# Patient Record
Sex: Male | Born: 1990 | Race: Black or African American | Hispanic: No | Marital: Single | Smoking: Current every day smoker
Health system: Southern US, Community
[De-identification: ages and names within clinical notes are randomized; demographics above are authoritative.]

## PROBLEM LIST (undated history)

## (undated) DIAGNOSIS — F1910 Other psychoactive substance abuse, uncomplicated: Secondary | ICD-10-CM

## (undated) DIAGNOSIS — Z5900 Homelessness unspecified: Secondary | ICD-10-CM

## (undated) HISTORY — PX: KNEE SURGERY: SHX244

---
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
## 2018-04-21 ENCOUNTER — Encounter (HOSPITAL_COMMUNITY): Payer: Self-pay | Admitting: Family Medicine

## 2018-04-21 ENCOUNTER — Emergency Department (HOSPITAL_COMMUNITY)
Admission: EM | Admit: 2018-04-21 | Discharge: 2018-04-21 | Discharge status: Against Medical Advice | Payer: Self-pay | Attending: Emergency Medicine | Admitting: Emergency Medicine

## 2018-04-21 ENCOUNTER — Other Ambulatory Visit: Payer: Self-pay

## 2018-04-21 DIAGNOSIS — T401X1A Poisoning by heroin, accidental (unintentional), initial encounter: Secondary | ICD-10-CM | POA: Insufficient documentation

## 2018-04-21 DIAGNOSIS — F17210 Nicotine dependence, cigarettes, uncomplicated: Secondary | ICD-10-CM | POA: Insufficient documentation

## 2018-04-21 LAB — CBG MONITORING, ED: Glucose-Capillary: 141 mg/dL — ABNORMAL HIGH (ref 70–99)

## 2018-04-21 MED ORDER — NALOXONE HCL 2 MG/2ML IJ SOSY
PREFILLED_SYRINGE | INTRAMUSCULAR | Status: AC
  Administered 2018-04-21: 22:00:00
  Filled 2018-04-21: qty 2

## 2018-04-21 NOTE — ED Triage Notes (Signed)
Patient was brought in by his mother. She reported he was a possible overdose.

## 2018-04-21 NOTE — ED Notes (Signed)
Patient immediately aroused up after [facility] administered. Patient is now demanding to leave. Patients mother is at bedside. Dr. Jeanell Sparrow is already been at bedside to explain about the plan of care and the risk of leaving.

## 2018-04-21 NOTE — ED Notes (Signed)
Dr. Jeanell Sparrow made aware that patient wants to leave-at bedside to talk to patient. Patient's Mother at bedside

## 2018-04-21 NOTE — ED Notes (Addendum)
Dr. Jeanell Sparrow came back into the room after patient signed his AMA form to inquire about how he is going to leave and what was his plan of care. Patient left facility via ambulatory with his mother.

## 2018-04-21 NOTE — ED Provider Notes (Signed)
[place] DEPT Provider Note   CSN: [id number] Arrival date & time: 04/21/18  2146     History   Chief Complaint Chief Complaint  Patient presents with  . Drug Overdose    HPI Calvin Sanchez is a 27 y.o. male. Level 5 caveat HPI  27 year old male presents today with altered mental status.  He presented via private vehicle.  He was brought in on a stretcher by nursing.  He has agonal respirations and is unresponsive  History reviewed. No pertinent past medical history.  There are no active problems to display for this patient.   Past Surgical History:  Procedure Laterality Date  . KNEE SURGERY Left         Home Medications    Prior to Admission medications   Not on File    Family History History reviewed. No pertinent family history.  Social History Social History   Tobacco Use  . Smoking status: Current Every Day Smoker    Types: Cigars  . Smokeless tobacco: Never Used  Substance Use Topics  . Alcohol use: Yes    Comment: Once a week. Last drink: yesterday   . Drug use: Yes    Types: Cocaine, Methamphetamines    Comment: Heronin. Last used: PTA. Cocaine and Meth last used this weekend.      Allergies   Patient has no allergy information on record.   Review of Systems Review of Systems  Unable to perform ROS: Acuity of condition     Physical Exam Updated Vital Signs BP (!) 123/97 (BP Location: Left Arm)   Pulse 99   Temp 98 F (36.7 C) (Oral)   Resp (!) 8   Ht 1.854 m (6\' 1" )   Wt 68 kg   SpO2 100%   BMI 19.79 kg/m   Physical Exam  Constitutional: He appears well-developed and well-nourished. No distress.  HENT:  Head: Normocephalic and atraumatic.  Right Ear: External ear normal.  Left Ear: External ear normal.  Mouth/Throat: Oropharynx is clear and moist.  Eyes:  Pinpoint pupils bilaterally  Neck: Normal range of motion.  Cardiovascular: Normal rate and regular rhythm.  Pulmonary/Chest:  Breath sounds normal.  Agonal respirations at 4 to 6/min  Abdominal: Soft. Bowel sounds are normal.  Musculoskeletal: Normal range of motion.  Neurological: He is unresponsive.  Skin: Skin is warm. Capillary refill takes less than 2 seconds.  Nursing note and vitals reviewed.    ED Treatments / Results  Labs (all labs ordered are listed, but only abnormal results are displayed) Labs Reviewed  CBG MONITORING, ED - Abnormal; Notable for the following components:      Result Value   Glucose-Capillary 141 (*)    All other components within normal limits    EKG None  Radiology No results found.  Procedures Procedures (including critical care time)  Medications Ordered in ED Medications  naloxone (NARCAN) 2 MG/2ML injection (has no administration in time range)     Initial Impression / Assessment and Plan / ED Course  I have reviewed the triage vital signs and the nursing notes.  Pertinent labs & imaging results that were available during my care of the patient were reviewed by me and considered in my medical decision making (see chart for details).     Patient given Narcan here and awake and alert. 10:06 PM Patient wishes to leave.  Discussed the fact that Narcan can have a shorter half life than the narcotic.  His mother is at  bedside and is being encouraged to stay Patient denies any suicidal ideation or health self-harm He is awake and alert and insistent on leaving.  Mother at bedside.  I discouraged leaving Calvin Sanchez but he is not insistent and appears to have competency. Final Clinical Impressions(s) / ED Diagnoses   Final diagnoses:  Accidental overdose of heroin, initial encounter [facility])    ED Discharge Orders    None       Pattricia Boss, MD 04/21/18 2214

## 2019-12-29 ENCOUNTER — Inpatient Hospital Stay (HOSPITAL_COMMUNITY)
Admission: EM | Admit: 2019-12-29 | Discharge: 2019-12-29 | DRG: 872 | Discharge status: Against Medical Advice | Payer: Self-pay | Attending: Internal Medicine | Admitting: Internal Medicine

## 2019-12-29 ENCOUNTER — Other Ambulatory Visit: Payer: Self-pay

## 2019-12-29 ENCOUNTER — Encounter (HOSPITAL_COMMUNITY): Payer: Self-pay | Admitting: Emergency Medicine

## 2019-12-29 ENCOUNTER — Inpatient Hospital Stay (HOSPITAL_COMMUNITY)
Admission: EM | Admit: 2019-12-29 | Discharge: 2019-12-31 | DRG: 603 | Discharge status: Against Medical Advice | Payer: Self-pay | Attending: Internal Medicine | Admitting: Internal Medicine

## 2019-12-29 DIAGNOSIS — F17290 Nicotine dependence, other tobacco product, uncomplicated: Secondary | ICD-10-CM | POA: Diagnosis present

## 2019-12-29 DIAGNOSIS — D72829 Elevated white blood cell count, unspecified: Secondary | ICD-10-CM

## 2019-12-29 DIAGNOSIS — F1410 Cocaine abuse, uncomplicated: Secondary | ICD-10-CM | POA: Diagnosis present

## 2019-12-29 DIAGNOSIS — F1110 Opioid abuse, uncomplicated: Secondary | ICD-10-CM | POA: Diagnosis present

## 2019-12-29 DIAGNOSIS — Z20822 Contact with and (suspected) exposure to covid-19: Secondary | ICD-10-CM | POA: Diagnosis present

## 2019-12-29 DIAGNOSIS — L03114 Cellulitis of left upper limb: Principal | ICD-10-CM

## 2019-12-29 DIAGNOSIS — L03119 Cellulitis of unspecified part of limb: Secondary | ICD-10-CM | POA: Diagnosis present

## 2019-12-29 DIAGNOSIS — R7989 Other specified abnormal findings of blood chemistry: Secondary | ICD-10-CM | POA: Diagnosis present

## 2019-12-29 DIAGNOSIS — D473 Essential (hemorrhagic) thrombocythemia: Secondary | ICD-10-CM

## 2019-12-29 DIAGNOSIS — R45851 Suicidal ideations: Secondary | ICD-10-CM | POA: Diagnosis present

## 2019-12-29 DIAGNOSIS — B999 Unspecified infectious disease: Secondary | ICD-10-CM

## 2019-12-29 DIAGNOSIS — F1910 Other psychoactive substance abuse, uncomplicated: Secondary | ICD-10-CM | POA: Diagnosis present

## 2019-12-29 DIAGNOSIS — F1510 Other stimulant abuse, uncomplicated: Secondary | ICD-10-CM | POA: Diagnosis present

## 2019-12-29 DIAGNOSIS — D75839 Thrombocytosis, unspecified: Secondary | ICD-10-CM

## 2019-12-29 DIAGNOSIS — F1990 Other psychoactive substance use, unspecified, uncomplicated: Secondary | ICD-10-CM | POA: Diagnosis present

## 2019-12-29 DIAGNOSIS — E871 Hypo-osmolality and hyponatremia: Secondary | ICD-10-CM | POA: Diagnosis present

## 2019-12-29 DIAGNOSIS — R945 Abnormal results of liver function studies: Secondary | ICD-10-CM

## 2019-12-29 DIAGNOSIS — A419 Sepsis, unspecified organism: Principal | ICD-10-CM

## 2019-12-29 DIAGNOSIS — R739 Hyperglycemia, unspecified: Secondary | ICD-10-CM

## 2019-12-29 HISTORY — DX: Other psychoactive substance abuse, uncomplicated: F19.10

## 2019-12-29 LAB — CBC WITH DIFFERENTIAL/PLATELET
Abs Immature Granulocytes: 0.13 10*3/uL — ABNORMAL HIGH (ref 0.00–0.07)
Basophils Absolute: 0.1 10*3/uL (ref 0.0–0.1)
Basophils Relative: 0 %
Eosinophils Absolute: 0.1 10*3/uL (ref 0.0–0.5)
Eosinophils Relative: 0 %
HCT: 40.9 % (ref 39.0–52.0)
Hemoglobin: 13.5 g/dL (ref 13.0–17.0)
Immature Granulocytes: 1 %
Lymphocytes Relative: 7 %
Lymphs Abs: 1.9 10*3/uL (ref 0.7–4.0)
MCH: 29.2 pg (ref 26.0–34.0)
MCHC: 33 g/dL (ref 30.0–36.0)
MCV: 88.5 fL (ref 80.0–100.0)
Monocytes Absolute: 1.5 10*3/uL — ABNORMAL HIGH (ref 0.1–1.0)
Monocytes Relative: 6 %
Neutro Abs: 22.2 10*3/uL — ABNORMAL HIGH (ref 1.7–7.7)
Neutrophils Relative %: 86 %
Platelets: 419 10*3/uL — ABNORMAL HIGH (ref 150–400)
RBC: 4.62 MIL/uL (ref 4.22–5.81)
RDW: 12 % (ref 11.5–15.5)
WBC: 25.9 10*3/uL — ABNORMAL HIGH (ref 4.0–10.5)
nRBC: 0 % (ref 0.0–0.2)

## 2019-12-29 LAB — RAPID URINE DRUG SCREEN, HOSP PERFORMED
Amphetamines: POSITIVE — AB
Barbiturates: NOT DETECTED
Benzodiazepines: NOT DETECTED
Cocaine: POSITIVE — AB
Opiates: NOT DETECTED
Tetrahydrocannabinol: NOT DETECTED

## 2019-12-29 LAB — COMPREHENSIVE METABOLIC PANEL
ALT: 73 U/L — ABNORMAL HIGH (ref 0–44)
AST: 70 U/L — ABNORMAL HIGH (ref 15–41)
Albumin: 3.8 g/dL (ref 3.5–5.0)
Alkaline Phosphatase: 108 U/L (ref 38–126)
Anion gap: 13 (ref 5–15)
BUN: 11 mg/dL (ref 6–20)
CO2: 25 mmol/L (ref 22–32)
Calcium: 9.7 mg/dL (ref 8.9–10.3)
Chloride: 94 mmol/L — ABNORMAL LOW (ref 98–111)
Creatinine, Ser: 0.73 mg/dL (ref 0.61–1.24)
GFR calc Af Amer: >60 mL/min (ref >60)
GFR calc non Af Amer: >60 mL/min (ref >60)
Glucose, Bld: 138 mg/dL — ABNORMAL HIGH (ref 70–99)
Potassium: 3.8 mmol/L (ref 3.5–5.1)
Sodium: 132 mmol/L — ABNORMAL LOW (ref 135–145)
Total Bilirubin: 0.8 mg/dL (ref 0.3–1.2)
Total Protein: 8.5 g/dL — ABNORMAL HIGH (ref 6.5–8.1)

## 2019-12-29 LAB — URINALYSIS, ROUTINE W REFLEX MICROSCOPIC
Bilirubin Urine: NEGATIVE
Glucose, UA: NEGATIVE mg/dL
Hgb urine dipstick: NEGATIVE
Ketones, ur: 5 mg/dL — AB
Leukocytes,Ua: NEGATIVE
Nitrite: NEGATIVE
Protein, ur: NEGATIVE mg/dL
Specific Gravity, Urine: 1.015 (ref 1.005–1.030)
pH: 7 (ref 5.0–8.0)

## 2019-12-29 LAB — RESPIRATORY PANEL BY RT PCR (FLU A&B, COVID)
Influenza A by PCR: NEGATIVE
Influenza B by PCR: NEGATIVE
SARS Coronavirus 2 by RT PCR: NEGATIVE

## 2019-12-29 LAB — LACTIC ACID, PLASMA: Lactic Acid, Venous: 1.7 mmol/L (ref 0.5–1.9)

## 2019-12-29 MED ORDER — ONDANSETRON HCL 4 MG/2ML IJ SOLN
4.0000 mg | Freq: Four times a day (QID) | INTRAMUSCULAR | Status: DC | PRN
  Administered 2019-12-29 – 2019-12-31 (×3): 4 mg via INTRAVENOUS
  Filled 2019-12-29 (×4): qty 2

## 2019-12-29 MED ORDER — SODIUM CHLORIDE 0.9 % IV BOLUS: 1000.0000 mL | Freq: Once | INTRAVENOUS | Status: DC

## 2019-12-29 MED ORDER — ONDANSETRON HCL 4 MG PO TABS: 4.0000 mg | ORAL_TABLET | Freq: Four times a day (QID) | ORAL | Status: DC | PRN

## 2019-12-29 MED ORDER — ENOXAPARIN SODIUM 40 MG/0.4ML ~~LOC~~ SOLN
40.0000 mg | Freq: Every day | SUBCUTANEOUS | Status: DC
  Administered 2019-12-30: 40 mg via SUBCUTANEOUS
  Filled 2019-12-29: qty 0.4

## 2019-12-29 MED ORDER — CEFAZOLIN SODIUM-DEXTROSE 1-4 GM/50ML-% IV SOLN
1.0000 g | Freq: Once | INTRAVENOUS | Status: AC
  Administered 2019-12-29: 1 g via INTRAVENOUS
  Filled 2019-12-29: qty 50

## 2019-12-29 MED ORDER — VANCOMYCIN HCL IN DEXTROSE 1-5 GM/200ML-% IV SOLN
1000.0000 mg | Freq: Once | INTRAVENOUS | Status: AC
  Administered 2019-12-29: 16:00:00 1000 mg via INTRAVENOUS
  Filled 2019-12-29: qty 200

## 2019-12-29 MED ORDER — KETOROLAC TROMETHAMINE 15 MG/ML IJ SOLN
15.0000 mg | Freq: Four times a day (QID) | INTRAMUSCULAR | Status: DC | PRN
  Administered 2019-12-29 – 2019-12-30 (×2): 15 mg via INTRAVENOUS
  Filled 2019-12-29 (×4): qty 1

## 2019-12-29 MED ORDER — VANCOMYCIN HCL IN DEXTROSE 1-5 GM/200ML-% IV SOLN
1000.0000 mg | Freq: Three times a day (TID) | INTRAVENOUS | Status: DC
  Administered 2019-12-29: 1000 mg via INTRAVENOUS
  Filled 2019-12-29 (×2): qty 200

## 2019-12-29 MED ORDER — SODIUM CHLORIDE 0.9 % IV SOLN: INTRAVENOUS | Status: DC

## 2019-12-29 MED ORDER — SODIUM CHLORIDE 0.9 % IV BOLUS
1000.0000 mL | Freq: Once | INTRAVENOUS | Status: AC
  Administered 2019-12-29: 1000 mL via INTRAVENOUS

## 2019-12-29 MED ORDER — SODIUM CHLORIDE 0.9 % IV SOLN
2.0000 g | Freq: Three times a day (TID) | INTRAVENOUS | Status: DC
  Administered 2019-12-30 – 2019-12-31 (×5): 2 g via INTRAVENOUS
  Filled 2019-12-29 (×6): qty 2

## 2019-12-29 NOTE — Progress Notes (Signed)
Pharmacy Antibiotic Note  Calvin Sanchez is a 29 y.o. male admitted on 12/29/2019 with cellulitis.  Pharmacy has been consulted for vancomycin & cefepime dosing. Pt reports Ht 6 foot tall, 150 pounds  Plan: Cefepime 2 gm IV q8h Vancomycin 1000 mg IV Q 8 hrs. Goal AUC 400-550. Expected AUC: 541.5 SCr used: 0.8, TBW>IBW F/u renal fxn, WBC, temp, culture data Vancomycin levels as needed     Temp (24hrs), Avg:98.5 F (36.9 C), Min:98.3 F (36.8 C), Max:98.7 F (37.1 C)  Recent Labs  Lab 12/29/19 1410 12/29/19 1451  WBC  --  25.9*  CREATININE  --  0.73  LATICACIDVEN 1.7  --     CrCl cannot be calculated (Unknown ideal weight.).    No Known Allergies  Antimicrobials this admission: 4/21 vanc>> 4/21 cefazolin x 1 4/22 cefepime>> Dose adjustments this admission:  Microbiology results: 4/21 BCx2: sent  Thank you for allowing pharmacy to be a part of this patient's care.  Eudelia Bunch, Pharm.D [phone] 12/29/2019 9:33 PM

## 2019-12-29 NOTE — ED Notes (Signed)
Unable to obtain second blood culture 

## 2019-12-29 NOTE — Consult Note (Signed)
Medical Consultation   ARNOLD KESTER  WEX:[id number]  DOB: 12-Dec-1990  DOA: 12/29/2019  PCP: Patient, No Pcp Per   Outpatient Specialists: None   Requesting physician: Isla Pence, MD  Reason for consultation: Admission for Cellulitis   History of Present Illness: Calvin Sanchez is an 29 y.o. male with no significant past medical history except polysubstance abuse and a knee surgery who presented to the emergency room with left arm pain after he injected himself with dirty needles with meth, heroin, cocaine.  He was found in his hotel where he had been doing drugs and EMS was called been injecting drugs to dirty needles.  He had not slept in several days and reportedly had some suicidal ideation which he told EMS however he denied this vehemently to me as well as to the EDP and stated that he has had this due to pain.  TRH was asked to admit this patient for cellulitis in the setting of his drug use and dirty needles however subsequently after I left the room he pulled out his IV and left AGAINST MEDICAL ADVICE.  Dr. Gilford Raid tried to get him to stay but he wanted to leave and she did not IVC him because he denied any suicidal ideation.   Review of Systems:  Review of Systems  Constitutional: Negative.   HENT: Negative.   Eyes: Negative.   Respiratory: Negative.   Cardiovascular: Negative.   Gastrointestinal: Negative.   Genitourinary: Negative.   Musculoskeletal: Positive for back pain and joint pain.  Skin: Negative for itching and rash.  Neurological: Negative.   Endo/Heme/Allergies: Negative.   Psychiatric/Behavioral: Positive for substance abuse. Negative for suicidal ideas.   As per HPI otherwise 10 point review of systems negative.   Past Medical History: Past Medical History:  Diagnosis Date  . Substance abuse [facility])    Past Surgical History: Past Surgical History:  Procedure Laterality Date  . KNEE SURGERY Left    Allergies:  No Known  Allergies  Social History:  reports that he has been smoking cigars. He has never used smokeless tobacco. He reports current alcohol use. He reports current drug use. Drugs: Cocaine and Methamphetamines.   Family History: History reviewed. No pertinent family history to patient's presenting complaints.  Physical Exam: Vitals:   12/29/19 1256 12/29/19 1257 12/29/19 1258 12/29/19 1621  BP:    120/85  Pulse: (!) 103 100 (!) 102 (!) 108  Resp:  (!) 28 18   Temp:      TempSrc:      SpO2: 99% 99% 100% 100%    Constitutional: Somnolent and drowsy but oriented x3, not in any acute distress. Eyes: Lids and conjunctiva normal. anicteric sclera,  ENMT: External ears and nose appear normal, normal hearing; poor dentition  Neck: neck appears normal, no masses, normal ROM, no thyromegaly, no JVD  CVS: S1-S2 clear, no murmur rubs or gallops,  Respiratory:  Diminished to auscultation bilaterally, no wheezing, rales or rhonchi. Respiratory effort normal. No accessory muscle use.  Abdomen: soft nontender, nondistended, normal bowel sounds, no hepatosplenomegaly, no hernias  Musculoskeletal: : no cyanosis, clubbing or edema noted bilaterally Neuro: Cranial nerves II-XII intact Psych: judgement and insight appear normal, stable mood and affect, mental status Skin: Multiple diffuse tattoos scattered throughout his body as well as multiple skin tracts from his prior injections doing intravenous  Data reviewed:  I have personally reviewed following labs and imaging  studies Labs:  CBC: Recent Labs  Lab 12/29/19 1451  WBC 25.9*  NEUTROABS 22.2*  HGB 13.5  HCT 40.9  MCV 88.5  PLT 419*    Basic Metabolic Panel: Recent Labs  Lab 12/29/19 1451  NA 132*  K 3.8  CL 94*  CO2 25  GLUCOSE 138*  BUN 11  CREATININE 0.73  CALCIUM 9.7   GFR CrCl cannot be calculated (Unknown ideal weight.). Liver Function Tests: Recent Labs  Lab 12/29/19 1451  AST 70*  ALT 73*  ALKPHOS 108  BILITOT 0.8    PROT 8.5*  ALBUMIN 3.8   No results for input(s): LIPASE, AMYLASE in the last 168 hours. No results for input(s): AMMONIA in the last 168 hours. Coagulation profile No results for input(s): INR, PROTIME in the last 168 hours.  Cardiac Enzymes: No results for input(s): CKTOTAL, CKMB, CKMBINDEX, TROPONINI in the last 168 hours. BNP: Invalid input(s): POCBNP CBG: No results for input(s): GLUCAP in the last 168 hours. D-Dimer No results for input(s): DDIMER in the last 72 hours. Hgb A1c No results for input(s): HGBA1C in the last 72 hours. Lipid Profile No results for input(s): CHOL, HDL, LDLCALC, TRIG, CHOLHDL, LDLDIRECT in the last 72 hours. Thyroid function studies No results for input(s): TSH, T4TOTAL, T3FREE, THYROIDAB in the last 72 hours.  Invalid input(s): FREET3 Anemia work up No results for input(s): VITAMINB12, FOLATE, FERRITIN, TIBC, IRON, RETICCTPCT in the last 72 hours. Urinalysis    Component Value Date/Time   COLORURINE YELLOW 12/29/2019 1451   APPEARANCEUR CLEAR 12/29/2019 1451   LABSPEC 1.015 12/29/2019 1451   PHURINE 7.0 12/29/2019 1451   GLUCOSEU NEGATIVE 12/29/2019 1451   HGBUR NEGATIVE 12/29/2019 1451   BILIRUBINUR NEGATIVE 12/29/2019 1451   KETONESUR 5 (A) 12/29/2019 1451   PROTEINUR NEGATIVE 12/29/2019 1451   NITRITE NEGATIVE 12/29/2019 [place] 12/29/2019 1451   Microbiology No results found for this or any previous visit (from the past 240 hour(s)).  Inpatient Medications:   Scheduled Meds: Continuous Infusions: . sodium chloride     Radiological Exams on Admission: No results found.  Impression/Recommendations Active Problems:   Cellulitis of arm   Polysubstance abuse ([name])   IVDU (intravenous drug user)   Sepsis ([place])   Leukocytosis   Thrombocytosis ([name])   Abnormal LFTs   Hyperglycemia  Sepsis secondary to Acute Cellulitis of the Left Arm associated with Pain -Met sepsis criteria on admission with a  leukocytosis, tachycardia and a source of infection -Given 2 L of NS  -I evaluated the patient in the emergency room and he does have a Cellulitis that is warm and erythematous and in the setting of IV drug use concern for MRSA so ordered vancomycin and ceftriaxone as antibiotics given the cellulitis order set; patient did use dirty needles -Was going to check inflammatory markers and repeat blood work and follow-up cultures -UDS was positive for cocaine and amphetamines and patient states that he took meth, heroin as well as cocaine -Lactic Acid was 1.7; Wanted to check PCT -I recommended admission as an inpatient for intravenous antibiotics given the patient has a significant cellulitis on his left arm in the setting of IV drug use however after I left the room, he left AGAINST MEDICAL ADVICE in the emergency room prior to being admitted -Wanted further imaging of his arm in order a DG x-ray of the forearm and elbow as well as the humerus to evaluate and was going to potentially evaluate with a CT scan  Polysubstance abuse with concern for potential withdrawal -Placed on a clonidine withdrawal protocol and recommended judicious use of narcotics given that he does take heroin -Unfortunately the patient left AGAINST MEDICAL ADVICE prior to even going upstairs for admission  Leukocytosis -In the setting of his cellulitis; WBC was 25.9 -Wanted to continue to monitor and trend however left AMA  Thrombocytosis -In the setting of cellulitis and sepsis  -Wanted to monitor and trend and repeat CBC in a.m. but patient had left AMA  Abnormal LFTs -AST was 70 and ALT of 73 -Wanted to work this up further and obtain a right upper quadrant ultrasound and an acute hepatitis panel however patient had left AGAINST MEDICAL ADVICE -likely could have been elevated in the setting of sepsis and his drug use  Hyperglycemia -Wanted to rule out a diabetes mellitus component and check a hemoglobin A1c in a.m.  however patient has left AGAINST MEDICAL ADVICE  Thank you for this consultation.  Patient has ultimately left AGAINST MEDICAL ADVICE in the emergency room despite my recommendation for admission for inpatient antibiotics  Time Spent: [place] D.O. [facility] 12/29/2019, 5:36 PM

## 2019-12-29 NOTE — ED Triage Notes (Signed)
EMS arrived to a hotel to find the patient with complaints of left arm pain. EMS noted the left [facility] area was red, swollen and warm to the touch. The patient had injected heroin, meth and cocaine into his left arm using a dirty needle. The patient also reports SI because of pain. The patient does not have a plan.   EMS vitals: 132/90 BP 104 HR 20 RR 100% O2 sat on room air 151 CBG

## 2019-12-29 NOTE — ED Notes (Signed)
Report called, IV will be placed before transport to floor.

## 2019-12-29 NOTE — ED Notes (Signed)
ED TO INPATIENT HANDOFF REPORT  ED Nurse Name and Phone #: Luz Lex RN  S Name/Age/Gender Calvin Sanchez 29 y.o. male Room/Bed: WA08/WA08  Code Status   Code Status: Not on file  Home/SNF/Other Home Patient oriented to: self Is this baseline? Yes   Triage Complete: Triage complete  Chief Complaint Left arm cellulitis [id number]  Triage Note Pt reports that he was being seen for infection in his left arm from iv drug use while ago but left AMA. Pt now back wanting to be seen again.     Allergies No Known Allergies  Level of Care/Admitting Diagnosis ED Disposition    ED Disposition Condition Comment   Admit  Hospital Area: [place] [[id number]]  Level of Care: Med-Surg [[id number]]  May admit patient to [facility] or [facility] if equivalent level of care is available:: No  Covid Evaluation: Asymptomatic Screening Protocol (No Symptoms)  Diagnosis: Left arm cellulitis [id number]  Admitting Physician: Elwyn Reach [[id number]]  Attending Physician: Elwyn Reach [[id number]]  Estimated length of stay: past midnight tomorrow  Certification:: I certify this patient will need inpatient services for at least 2 midnights       B Medical/Surgery History Past Medical History:  Diagnosis Date  . Substance abuse [facility])    Past Surgical History:  Procedure Laterality Date  . KNEE SURGERY Left      A IV Location/Drains/Wounds Patient Lines/Drains/Airways Status   Active Line/Drains/Airways    Name:   Placement date:   Placement time:   Site:   Days:   Peripheral IV 12/29/19 Right;Anterior Forearm   12/29/19    2129    Forearm   less than 1          Intake/Output Last 24 hours No intake or output data in the 24 hours ending 12/29/19 2133  Labs/Imaging Results for orders placed or performed during the hospital encounter of 12/29/19 (from the past 48 hour(s))  Lactic acid, plasma     Status: None   Collection Time: 12/29/19  2:10 PM  Result Value Ref  Sanchez   Lactic Acid, Venous 1.7 0.5 - 1.9 mmol/L    Comment: Performed at [facility], [place] [address] [place], [place] [address]  Comprehensive metabolic panel     Status: Abnormal   Collection Time: 12/29/19  2:51 PM  Result Value Ref Sanchez   Sodium 132 (L) 135 - 145 mmol/L   Potassium 3.8 3.5 - 5.1 mmol/L   Chloride 94 (L) 98 - 111 mmol/L   CO2 25 22 - 32 mmol/L   Glucose, Bld 138 (H) 70 - 99 mg/dL    Comment: Glucose reference Sanchez applies only to samples taken after fasting for at least 8 hours.   BUN 11 6 - 20 mg/dL   Creatinine, Ser 0.73 0.61 - 1.24 mg/dL   Calcium 9.7 8.9 - 10.3 mg/dL   Total Protein 8.5 (H) 6.5 - 8.1 g/dL   Albumin 3.8 3.5 - 5.0 g/dL   AST 70 (H) 15 - 41 U/L   ALT 73 (H) 0 - 44 U/L   Alkaline Phosphatase 108 38 - 126 U/L   Total Bilirubin 0.8 0.3 - 1.2 mg/dL   GFR calc non Af Amer >60 >60 mL/min   GFR calc Af Amer >60 >60 mL/min   Anion gap 13 5 - 15    Comment: Performed at [facility], [place] [address] [place], [place] [address]  CBC with Differential  Status: Abnormal   Collection Time: 12/29/19  2:51 PM  Result Value Ref Sanchez   WBC 25.9 (H) 4.0 - 10.5 K/uL   RBC 4.62 4.22 - 5.81 MIL/uL   Hemoglobin 13.5 13.0 - 17.0 g/dL   HCT 40.9 39.0 - 52.0 %   MCV 88.5 80.0 - 100.0 fL   MCH 29.2 26.0 - 34.0 pg   MCHC 33.0 30.0 - 36.0 g/dL   RDW 12.0 11.5 - 15.5 %   Platelets 419 (H) 150 - 400 K/uL   nRBC 0.0 0.0 - 0.2 %   Neutrophils Relative % 86 %   Neutro Abs 22.2 (H) 1.7 - 7.7 K/uL   Lymphocytes Relative 7 %   Lymphs Abs 1.9 0.7 - 4.0 K/uL   Monocytes Relative 6 %   Monocytes Absolute 1.5 (H) 0.1 - 1.0 K/uL   Eosinophils Relative 0 %   Eosinophils Absolute 0.1 0.0 - 0.5 K/uL   Basophils Relative 0 %   Basophils Absolute 0.1 0.0 - 0.1 K/uL   WBC Morphology VACUOLATED NEUTROPHILS    Immature Granulocytes 1 %   Abs Immature Granulocytes 0.13 (H) 0.00 - 0.07 K/uL    Comment: Performed at [facility], [place] [address], [place], [place] [address]  Urinalysis, Routine w reflex microscopic     Status: Abnormal   Collection Time: 12/29/19  2:51 PM  Result Value Ref Sanchez   Color, Urine YELLOW YELLOW   APPearance CLEAR CLEAR   Specific Gravity, Urine 1.015 1.005 - 1.030   pH 7.0 5.0 - 8.0   Glucose, UA NEGATIVE NEGATIVE mg/dL   Hgb urine dipstick NEGATIVE NEGATIVE   Bilirubin Urine NEGATIVE NEGATIVE   Ketones, ur 5 (A) NEGATIVE mg/dL   Protein, ur NEGATIVE NEGATIVE mg/dL   Nitrite NEGATIVE NEGATIVE   Leukocytes,Ua NEGATIVE NEGATIVE    Comment: Performed at [place] [address] [place], [place] [address]  Urine rapid drug screen (hosp performed)     Status: Abnormal   Collection Time: 12/29/19  2:51 PM  Result Value Ref Sanchez   Opiates NONE DETECTED NONE DETECTED   Cocaine POSITIVE (A) NONE DETECTED   Benzodiazepines NONE DETECTED NONE DETECTED   Amphetamines POSITIVE (A) NONE DETECTED   Tetrahydrocannabinol NONE DETECTED NONE DETECTED   Barbiturates NONE DETECTED NONE DETECTED    Comment: (NOTE) DRUG SCREEN FOR MEDICAL PURPOSES ONLY.  IF CONFIRMATION IS NEEDED FOR ANY PURPOSE, NOTIFY LAB WITHIN 5 DAYS. LOWEST DETECTABLE LIMITS FOR URINE DRUG SCREEN Drug Class                     Cutoff (ng/mL) Amphetamine and metabolites    1000 Barbiturate and metabolites    200 Benzodiazepine                 [id number] Tricyclics and metabolites     300 Opiates and metabolites        300 Cocaine and metabolites        300 THC                            50 Performed at [facility], [place] [address], [place], [place] [address]    No results found.  Pending Labs [employer] (From admission, onward)    Start     Ordered   Signed and Held  CBC  (enoxaparin (LOVENOX)    CrCl >/= 30 ml/min)  Once,  R    Comments: Baseline for enoxaparin therapy IF NOT ALREADY DRAWN.  Notify MD if PLT < 100 K.    Signed and Held   Signed and  Held  Creatinine, serum  (enoxaparin (LOVENOX)    CrCl >/= 30 ml/min)  Once,   R    Comments: Baseline for enoxaparin therapy IF NOT ALREADY DRAWN.    Signed and Held   Signed and Held  Creatinine, serum  (enoxaparin (LOVENOX)    CrCl >/= 30 ml/min)  Weekly,   R    Comments: while on enoxaparin therapy    Signed and Held   Signed and Held  Comprehensive metabolic panel  Tomorrow morning,   R     Signed and Held   Signed and Held  CBC  Tomorrow morning,   R     Signed and Held          Vitals/Pain Today's Vitals   12/29/19 1756  BP: (!) 129/106  Pulse: (!) 115  Resp: 16  Temp: 98.7 F (37.1 C)  TempSrc: Oral  SpO2: 100%    Isolation Precautions No active isolations  Medications Medications - No data to display  Mobility walks Low fall risk   Focused Assessments arm   R Recommendations: See Admitting Provider Note  Report given to: Debbie RN  Additional Notes: None

## 2019-12-29 NOTE — H&P (Signed)
History and Physical   Calvin Sanchez [id number] DOB: [birth year] DOA: 12/29/2019  Referring MD/NP/PA: Dr. Ronnald Sanchez  PCP: Patient, No Pcp Per   Outpatient Specialists: None  Patient coming from: Home  Chief Complaint: Left arm swelling  HPI: Calvin Sanchez is a 29 y.o. male with medical history significant of IV drug abuse who was seen earlier in the day with left forearm and antecubital fossa cellulitis.  Patient was evaluated and was supposed to have been admitted but decided to leave [place].  Patient returned this evening due to worsening swelling and pain.  He is now agreeable to admission and treatment inpatient.  Patient apparently has had suicidal ideation earlier but not at this point.  He has been injecting heroin meth and cocaine through dirty needles.  He reported not sleeping in several days.  He is not giving adequate history.  Patient has significant leukocytosis and low-grade fever.  He is being admitted with cellulitis of the left upper extremity.  No obvious abscess seen..  ED Course: Temperature 98.7 blood pressure 129/106 pulse 115 respirate of 28 oxygen sat 98% room air.  Sodium is 132 potassium 3.8 chloride 94 CO2 25 with glucose 138.  White count 25.9 lactic acid 1.7.  Left shift with ANC of 22.2.  COVID-19 screen is negative.  Urine drug screen is positive for amphetamines and cocaine.  Blood cultures obtained and patient being admitted for further treatment.  Review of Systems: As per HPI otherwise 10 point review of systems negative.    Past Medical History:  Diagnosis Date  . Substance abuse [facility])     Past Surgical History:  Procedure Laterality Date  . KNEE SURGERY Left      reports that he has been smoking cigars. He has never used smokeless tobacco. He reports current alcohol use. He reports current drug use. Drugs: Cocaine and Methamphetamines.  No Known Allergies  No family history on file.   Prior to Admission medications     Not on File    Physical Exam: Vitals:   12/29/19 1756 12/29/19 2206 12/30/19 0007  BP: (!) 129/106 127/80 117/71  Pulse: (!) 115 (!) 107 (!) 102  Resp: 16 17 20   Temp: 98.7 F (37.1 C) 98.4 F (36.9 C) 98.3 F (36.8 C)  TempSrc: Oral Oral Oral  SpO2: 100% 100% 100%      Constitutional: Withdrawn, none conversational Vitals:   12/29/19 1756 12/29/19 2206 12/30/19 0007  BP: (!) 129/106 127/80 117/71  Pulse: (!) 115 (!) 107 (!) 102  Resp: 16 17 20   Temp: 98.7 F (37.1 C) 98.4 F (36.9 C) 98.3 F (36.8 C)  TempSrc: Oral Oral Oral  SpO2: 100% 100% 100%   Eyes: PERRL, lids and conjunctivae normal ENMT: Mucous membranes are moist. Posterior pharynx clear of any exudate or lesions.Normal dentition.  Neck: normal, supple, no masses, no thyromegaly Respiratory: clear to auscultation bilaterally, no wheezing, no crackles. Normal respiratory effort. No accessory muscle use.  Cardiovascular: Sinus tachycardia no murmurs / rubs / gallops. No extremity edema. 2+ pedal pulses. No carotid bruits.  Abdomen: no tenderness, no masses palpated. No hepatosplenomegaly. Bowel sounds positive.  Musculoskeletal: no clubbing / cyanosis. No joint deformity upper and lower extremities. Good ROM, no contractures. Normal muscle tone.  Skin: Left upper extremity is swollen around the antecubital fossa on the forearm, red, warm to touch, no fluctuance and no evidence of abscess no rashes, lesions, ulcers. No induration Neurologic: CN 2-12 grossly intact. Sensation intact, DTR  normal. Strength 5/5 in all 4.  Psychiatric: Withdrawn, depressed mood, not wanting to engage in conversation, not suicidal and not homicidal    Labs on Admission: I have personally reviewed following labs and imaging studies  CBC: Recent Labs  Lab 12/29/19 1451  WBC 25.9*  NEUTROABS 22.2*  HGB 13.5  HCT 40.9  MCV 88.5  PLT 123XX123*   Basic Metabolic Panel: Recent Labs  Lab 12/29/19 1451  NA 132*  K 3.8  CL 94*   CO2 25  GLUCOSE 138*  BUN 11  CREATININE 0.73  CALCIUM 9.7   GFR: CrCl cannot be calculated (Unknown ideal weight.). Liver Function Tests: Recent Labs  Lab 12/29/19 1451  AST 70*  ALT 73*  ALKPHOS 108  BILITOT 0.8  PROT 8.5*  ALBUMIN 3.8   No results for input(s): LIPASE, AMYLASE in the last 168 hours. No results for input(s): AMMONIA in the last 168 hours. Coagulation Profile: No results for input(s): INR, PROTIME in the last 168 hours. Cardiac Enzymes: No results for input(s): CKTOTAL, CKMB, CKMBINDEX, TROPONINI in the last 168 hours. BNP (last 3 results) No results for input(s): PROBNP in the last 8760 hours. HbA1C: No results for input(s): HGBA1C in the last 72 hours. CBG: No results for input(s): GLUCAP in the last 168 hours. Lipid Profile: No results for input(s): CHOL, HDL, LDLCALC, TRIG, CHOLHDL, LDLDIRECT in the last 72 hours. Thyroid Function Tests: No results for input(s): TSH, T4TOTAL, FREET4, T3FREE, THYROIDAB in the last 72 hours. Anemia Panel: No results for input(s): VITAMINB12, FOLATE, FERRITIN, TIBC, IRON, RETICCTPCT in the last 72 hours. Urine analysis:    Component Value Date/Time   COLORURINE YELLOW 12/29/2019 1451   APPEARANCEUR CLEAR 12/29/2019 1451   LABSPEC 1.015 12/29/2019 1451   PHURINE 7.0 12/29/2019 1451   GLUCOSEU NEGATIVE 12/29/2019 1451   HGBUR NEGATIVE 12/29/2019 1451   BILIRUBINUR NEGATIVE 12/29/2019 1451   KETONESUR 5 (A) 12/29/2019 1451   PROTEINUR NEGATIVE 12/29/2019 1451   NITRITE NEGATIVE 12/29/2019 1451   LEUKOCYTESUR NEGATIVE 12/29/2019 1451   Sepsis Labs: @LABRCNTIP (procalcitonin:4,lacticidven:4) ) Recent Results (from the past 240 hour(s))  Respiratory Panel by RT PCR (Flu A&B, Covid) - Nasopharyngeal Swab     Status: None   Collection Time: 12/29/19  8:51 PM   Specimen: Nasopharyngeal Swab  Result Value Ref Range Status   SARS Coronavirus 2 by RT PCR NEGATIVE NEGATIVE Final    Comment: (NOTE) SARS-CoV-2 target  nucleic acids are NOT DETECTED. The SARS-CoV-2 RNA is generally detectable in upper respiratoy specimens during the acute phase of infection. The lowest concentration of SARS-CoV-2 viral copies this assay can detect is 131 copies/mL. A negative result does not preclude SARS-Cov-2 infection and should not be used as the sole basis for treatment or other patient management decisions. A negative result may occur with  improper specimen collection/handling, submission of specimen other than nasopharyngeal swab, presence of viral mutation(s) within the areas targeted by this assay, and inadequate number of viral copies (<131 copies/mL). A negative result must be combined with clinical observations, patient history, and epidemiological information. The expected result is Negative. Fact Sheet for Patients:  PinkCheek.be Fact Sheet for Healthcare Providers:  GravelBags.it This test is not yet ap proved or cleared by the [place] FDA and  has been authorized for detection and/or diagnosis of SARS-CoV-2 by FDA under an Emergency Use Authorization (EUA). This EUA will remain  in effect (meaning this test can be used) for the duration of the COVID-19 declaration under Section  564(b)(1) of the Act, 21 U.S.C. section 360bbb-3(b)(1), unless the authorization is terminated or revoked sooner.    Influenza A by PCR NEGATIVE NEGATIVE Final   Influenza B by PCR NEGATIVE NEGATIVE Final    Comment: (NOTE) The Xpert Xpress SARS-CoV-2/FLU/RSV assay is intended as an aid in  the diagnosis of influenza from Nasopharyngeal swab specimens and  should not be used as a sole basis for treatment. Nasal washings and  aspirates are unacceptable for Xpert Xpress SARS-CoV-2/FLU/RSV  testing. Fact Sheet for Patients: PinkCheek.be Fact Sheet for Healthcare Providers: GravelBags.it This test is not  yet approved or cleared by the [place] FDA and  has been authorized for detection and/or diagnosis of SARS-CoV-2 by  FDA under an Emergency Use Authorization (EUA). This EUA will remain  in effect (meaning this test can be used) for the duration of the  Covid-19 declaration under Section 564(b)(1) of the Act, 21  U.S.C. section 360bbb-3(b)(1), unless the authorization is  terminated or revoked. Performed at [facility], [place] [address], [place], [place] [address]      Radiological Exams on Admission: No results found.    Assessment/Plan Principal Problem:   Left arm cellulitis Active Problems:   Polysubstance abuse ([name])   IVDU (intravenous drug user)   Thrombocytosis ([name])     #1 cellulitis of the left upper extremity: Swollen red and tender around the antecubital fossa.  No obvious abscess.  Admit the patient initiate IV antibiotics.  If no response or more swelling may consider CT to look for deeper abscess.  At this point however does not appear to have any abscess.  Initial IV antibiotics while cultures are pending.  Will start vancomycin and cefepime.  Blood cultures obtained.  Elevate the arm and supportive care.  #2 IV drug abuse: Education officer, museum consult for counseling.  Patient has cocaine and amphetamines in the system now.  He reported using heroin as well.  #3 leukocytosis: Secondary to his cellulitis most likely.  Continue treatment.   DVT prophylaxis: Lovenox Code Status: Full code Family Communication: No family available Disposition Plan: To be determined Consults called: None Admission status: Inpatient  Severity of Illness: The appropriate patient status for this patient is INPATIENT. Inpatient status is judged to be reasonable and necessary in order to provide the required intensity of service to ensure the patient's safety. The patient's presenting symptoms, physical exam findings, and initial radiographic and laboratory data in the  context of their chronic comorbidities is felt to place them at high risk for further clinical deterioration. Furthermore, it is not anticipated that the patient will be medically stable for discharge from the hospital within 2 midnights of admission. The following factors support the patient status of inpatient.   " The patient's presenting symptoms include swelling and redness of the left upper extremity. " The worrisome physical exam findings include cellulitis of forearm and upper arm. " The initial radiographic and laboratory data are worrisome because of leukocytosis with white count 21,000. " The chronic co-morbidities include IV drug abuse.   * I certify that at the point of admission it is my clinical judgment that the patient will require inpatient hospital care spanning beyond 2 midnights from the point of admission due to high intensity of service, high risk for further deterioration and high frequency of surveillance required.Barbette Merino MD [facility] Pager [phone]  If 7PM-7AM, please contact night-coverage [email or web address] Password [facility]  12/30/2019, 12:15 AM

## 2019-12-29 NOTE — ED Notes (Signed)
Patient pulled out his IV and proceeded to walk down the hall in an effort to leave while bleeding all over the floor. Patient was uncooperative with staff with attempts to get the patient to go back into the room and allow staff to stop the bleeding. After multiple attempts the patient went back to his room and allowed staff to apply pressure to the area. Patient refused all attempts for staff to educate him on the reason he should stay. Patient left AMA.

## 2019-12-29 NOTE — ED Provider Notes (Addendum)
[facility] DEPT Provider Note   CSN: [id number] Arrival date & time: 12/29/19  1232     History Chief Complaint  Patient presents with  . Arm Pain  . Suicidal    Calvin Sanchez is a 29 y.o. male.  Pt presents to the ED today with left arm pain and suicidal ideation.  EMS said they picked pt up at a hotel.  He has been injecting heroin, meth, and cocaine through dirty needles.  Pt has not slept in several days.  He denies si now.  He said he just wants to eat and to sleep.        Past Medical History:  Diagnosis Date  . Substance abuse [facility])     Patient Active Problem List   Diagnosis Date Noted  . Cellulitis of arm 12/29/2019    Past Surgical History:  Procedure Laterality Date  . KNEE SURGERY Left        History reviewed. No pertinent family history.  Social History   Tobacco Use  . Smoking status: Current Every Day Smoker    Types: Cigars  . Smokeless tobacco: Never Used  Substance Use Topics  . Alcohol use: Yes    Comment: Once a week. Last drink: yesterday   . Drug use: Yes    Types: Cocaine, Methamphetamines    Comment: Heronin. Last used: PTA. Cocaine and Meth last used this weekend.     Home Medications Prior to Admission medications   Not on File    Allergies    Patient has no known allergies.  Review of Systems   Review of Systems  Musculoskeletal:       Left arm pain  All other systems reviewed and are negative.   Physical Exam Updated Vital Signs BP 120/85   Pulse (!) 108   Temp 98.3 F (36.8 C) (Oral)   Resp 18   SpO2 100%   Physical Exam Vitals and nursing note reviewed.  Constitutional:      Appearance: Normal appearance.  HENT:     Head: Normocephalic and atraumatic.     Right Ear: External ear normal.     Left Ear: External ear normal.     Nose: Nose normal.     Mouth/Throat:     Mouth: Mucous membranes are dry.  Eyes:     Extraocular Movements: Extraocular movements intact.       Conjunctiva/sclera: Conjunctivae normal.     Pupils: Pupils are equal, round, and reactive to light.  Cardiovascular:     Rate and Rhythm: Normal rate and regular rhythm.     Pulses: Normal pulses.     Heart sounds: Normal heart sounds.  Pulmonary:     Effort: Pulmonary effort is normal.     Breath sounds: Normal breath sounds.  Abdominal:     General: Abdomen is flat. Bowel sounds are normal.     Palpations: Abdomen is soft.  Musculoskeletal:        General: Normal range of motion.     Cervical back: Normal range of motion and neck supple.  Skin:    Capillary Refill: Capillary refill takes less than 2 seconds.     Comments: Track marks all over arms. Cellulitis left arm Impetigo type wound to right ac  Neurological:     General: No focal deficit present.     Mental Status: He is alert and oriented to person, place, and time.  Psychiatric:        Mood and  Affect: Mood is depressed.        Behavior: Behavior is slowed.     ED Results / Procedures / Treatments   Labs (all labs ordered are listed, but only abnormal results are displayed) Labs Reviewed  COMPREHENSIVE METABOLIC PANEL - Abnormal; Notable for the following components:      Result Value   Sodium 132 (*)    Chloride 94 (*)    Glucose, Bld 138 (*)    Total Protein 8.5 (*)    AST 70 (*)    ALT 73 (*)    All other components within normal limits  CBC WITH DIFFERENTIAL/PLATELET - Abnormal; Notable for the following components:   WBC 25.9 (*)    Platelets 419 (*)    Neutro Abs 22.2 (*)    Monocytes Absolute 1.5 (*)    Abs Immature Granulocytes 0.13 (*)    All other components within normal limits  URINALYSIS, ROUTINE W REFLEX MICROSCOPIC - Abnormal; Notable for the following components:   Ketones, ur 5 (*)    All other components within normal limits  RAPID URINE DRUG SCREEN, HOSP PERFORMED - Abnormal; Notable for the following components:   Cocaine POSITIVE (*)    Amphetamines POSITIVE (*)    All other  components within normal limits  CULTURE, BLOOD (ROUTINE X 2)  CULTURE, BLOOD (ROUTINE X 2)  RESPIRATORY PANEL BY RT PCR (FLU A&B, COVID)  LACTIC ACID, PLASMA    EKG EKG Interpretation  Date/Time:  Wednesday December 29 2019 12:57:44 EDT Ventricular Rate:  104 PR Interval:    QRS Duration: 83 QT Interval:  339 QTC Calculation: 446 R Axis:   70 Text Interpretation: Sinus tachycardia Probable left atrial enlargement Probable left ventricular hypertrophy No significant change since last tracing Confirmed by Isla Pence [id number]) on 12/29/2019 1:11:38 PM   Radiology No results found.  Procedures Procedures (including critical care time)  Medications Ordered in ED Medications  vancomycin (VANCOCIN) IVPB 1000 mg/200 mL premix (1,000 mg Intravenous New Bag/Given 12/29/19 1622)  sodium chloride 0.9 % bolus 1,000 mL (has no administration in time range)  sodium chloride 0.9 % bolus 1,000 mL (0 mLs Intravenous Stopped 12/29/19 1622)  ceFAZolin (ANCEF) IVPB 1 g/50 mL premix (0 g Intravenous Stopped 12/29/19 1529)    ED Course  I have reviewed the triage vital signs and the nursing notes.  Pertinent labs & imaging results that were available during my care of the patient were reviewed by me and considered in my medical decision making (see chart for details).    MDM Rules/Calculators/A&P                      Pt given vancomycin and ancef.  He does not meet sepsis criteria, but WBC is quite elevated.  He will likely not take abx if d/c home.  IVFs ordered.  Pt d/w Dr. Alfredia Ferguson ([facility]) who will see him.  Pt's nurse told me that pt took out his IV and left.  I found him outside and tried to get him to stay, but he wants to leave.  He denies si, so I did not do an IVC.  He is told to come back any time.  He said he called his sister to pick him up.  He did not want to wait for any abx or directions.  Final Clinical Impression(s) / ED Diagnoses Final diagnoses:  Polysubstance abuse ([place])    Cellulitis of left upper extremity    Rx /  DC Orders ED Discharge Orders    None       Isla Pence, MD 12/29/19 1635    Isla Pence, MD 12/29/19 1715

## 2019-12-29 NOTE — ED Provider Notes (Signed)
[facility] DEPT Provider Note   CSN: [id number] Arrival date & time: 12/29/19  1738     History Chief Complaint  Patient presents with  . arm infection    Calvin Sanchez is a 29 y.o. male with PMHx substance abuse who presents to the ED today for arm infection. Pt was seen in the ED earlier today with complaint of left arm pain and suicidal ideation. Per note he has been injecting heroin, meth, and cocaine through dirty needles. WBC count noted to be elevated at 25.9; pt treated with vanc and ancef with plan for admission. Prior to Dr. Alfredia Ferguson with [facility] going to see patient for admission pt took out his IV and left. He was unable to be convinced to stay. Pt returns to the ED stating he is ready to be admitted. He is again denying SI currently.   The history is provided by the patient and medical records.       Past Medical History:  Diagnosis Date  . Substance abuse [facility])     Patient Active Problem List   Diagnosis Date Noted  . Cellulitis of arm 12/29/2019  . Polysubstance abuse ([place]) 12/29/2019  . IVDU (intravenous drug user) 12/29/2019  . Sepsis ([place]) 12/29/2019  . Leukocytosis 12/29/2019  . Thrombocytosis ([place]) 12/29/2019  . Abnormal LFTs 12/29/2019  . Hyperglycemia 12/29/2019  . Left arm cellulitis 12/29/2019    Past Surgical History:  Procedure Laterality Date  . KNEE SURGERY Left        No family history on file.  Social History   Tobacco Use  . Smoking status: Current Every Day Smoker    Types: Cigars  . Smokeless tobacco: Never Used  Substance Use Topics  . Alcohol use: Yes    Comment: Once a week. Last drink: yesterday   . Drug use: Yes    Types: Cocaine, Methamphetamines    Comment: Heronin. Last used: PTA. Cocaine and Meth last used this weekend.     Home Medications Prior to Admission medications   Not on File    Allergies    Patient has no known allergies.  Review of Systems   Review of  Systems  Constitutional: Positive for fever.  Musculoskeletal: Positive for arthralgias.  Skin: Positive for color change.  All other systems reviewed and are negative.   Physical Exam Updated Vital Signs BP (!) 129/106   Pulse (!) 115   Temp 98.7 F (37.1 C) (Oral)   Resp 16   SpO2 100%   Physical Exam Vitals and nursing note reviewed.  Constitutional:      Appearance: He is not ill-appearing.  HENT:     Head: Normocephalic and atraumatic.  Eyes:     Conjunctiva/sclera: Conjunctivae normal.  Cardiovascular:     Rate and Rhythm: Normal rate and regular rhythm.  Pulmonary:     Effort: Pulmonary effort is normal.     Breath sounds: Normal breath sounds.  Abdominal:     Palpations: Abdomen is soft.     Tenderness: There is no abdominal tenderness.  Musculoskeletal:     Cervical back: Neck supple.     Comments: Cellulitis present to left upper arm and forearm; multiple track marks appreciated  Skin:    General: Skin is warm and dry.  Neurological:     Mental Status: He is alert.     ED Results / Procedures / Treatments   Labs (all labs ordered are listed, but only abnormal results are displayed) Labs  Reviewed  RESPIRATORY PANEL BY RT PCR (FLU A&B, COVID)    EKG None  Radiology No results found.  Procedures Procedures (including critical care time)  Medications Ordered in ED Medications - No data to display  ED Course  I have reviewed the triage vital signs and the nursing notes.  Pertinent labs & imaging results that were available during my care of the patient were reviewed by me and considered in my medical decision making (see chart for details).    MDM Rules/Calculators/A&P                      28 year old male who presents to the ED after being seen earlier today with plan for admission s/2 cellulitis of left upper extremity from IVDU. Please see previous ED note. It appears pt left prior to being admitted. All labwork completed besides COVID  testing. Pt returns stating he is ready for admission at this time. Given he had labwork earlier today do not feel he needs repeat imaging. Has already been treated with vanc and ancef during earlier visit. Will place IV again and swab for COVID. Will call hospitalist for admission.   Discussed case with Dr. Jonelle Sidle who agrees to evaluate patient for admission.   This note was prepared using Dragon voice recognition software and may include unintentional dictation errors due to the inherent limitations of voice recognition software.  Final Clinical Impression(s) / ED Diagnoses Final diagnoses:  Cellulitis of left upper extremity  Substance abuse [facility])    Rx / DC Orders ED Discharge Orders    None       Eustaquio Maize, PA-C 12/29/19 2045    Lennice Sites, DO 12/29/19 2203

## 2019-12-29 NOTE — ED Triage Notes (Signed)
Pt reports that he was being seen for infection in his left arm from iv drug use while ago but left AMA. Pt now back wanting to be seen again.

## 2019-12-30 ENCOUNTER — Inpatient Hospital Stay (HOSPITAL_COMMUNITY): Payer: Self-pay

## 2019-12-30 LAB — MRSA PCR SCREENING: MRSA by PCR: NEGATIVE

## 2019-12-30 MED ORDER — HYDROXYZINE HCL 25 MG PO TABS
25.0000 mg | ORAL_TABLET | Freq: Three times a day (TID) | ORAL | Status: DC | PRN
  Administered 2019-12-30 (×2): 25 mg via ORAL
  Filled 2019-12-30 (×2): qty 1

## 2019-12-30 MED ORDER — LOPERAMIDE HCL 2 MG PO CAPS
4.0000 mg | ORAL_CAPSULE | Freq: Once | ORAL | Status: AC
  Administered 2019-12-30: 4 mg via ORAL
  Filled 2019-12-30: qty 2

## 2019-12-30 MED ORDER — VANCOMYCIN HCL 750 MG/150ML IV SOLN
750.0000 mg | Freq: Three times a day (TID) | INTRAVENOUS | Status: DC
  Administered 2019-12-30 (×2): 750 mg via INTRAVENOUS
  Filled 2019-12-30 (×3): qty 150

## 2019-12-30 NOTE — Progress Notes (Signed)
PROGRESS NOTE    Calvin Sanchez  [id number] DOB: 03-08-1991 DOA: 12/29/2019 PCP: Patient, No Pcp Per   Brief Narrative: 29 year old with past medical history significant for IV drug abuse who presented with left forearm and antecubital fossa cellulitis.  Patient was admitted to the hospital for IV antibiotics.  Assessment & Plan:   Principal Problem:   Left arm cellulitis Active Problems:   Polysubstance abuse ([name])   IVDU (intravenous drug user)   Thrombocytosis ([place])   1-cellulitis of the left upper extremity: The patient with a history of IV drug use.  Continue with IV antibiotics for another 24 hours.  Will check ultrasound to evaluate for abscess.  Follow blood cultures.  2-IV drug abuse: Counseling provided to patient.  Social worker consulted  3-leukocytosis: Continue with IV antibiotics. Follow trend 4-hyponatremia: Continue with IV fluids  Estimated body mass index is 19.79 kg/m as calculated from the following:   Height as of 04/21/18: 6\' 1"  (1.854 m).   Weight as of 04/21/18: 68 kg.   DVT prophylaxis: Lovenox Code Status: Code Family Communication: Care discussed with patient Disposition Plan:  Patient is from: Home Anticipated d/c date: Home in 1 or 2 days.  Barriers to d/c or necessity for inpatient status: Patient with persistent redness and swelling of the left upper extremity, high risk for opportunistic infection due to IV drug use, continue with IV antibiotic  Consultants:   None  Procedures:   Ultrasound  Antimicrobials:  Vancomycin and cefepime Subjective: Patient is sleepy, he report improvement of the swelling of his left arm, is less swollen and less painful.  Objective: Vitals:   12/29/19 2206 12/30/19 0007 12/30/19 0217 12/30/19 0606  BP: 127/80 117/71 (!) 147/98 132/85  Pulse: (!) 107 (!) 102 (!) 108 88  Resp: 17 20 20  (!) 21  Temp: 98.4 F (36.9 C) 98.3 F (36.8 C) 98.9 F (37.2 C) 98.2 F (36.8 C)  TempSrc: Oral Oral  Oral Oral  SpO2: 100% 100% 100% 100%    Intake/Output Summary (Last 24 hours) at 12/30/2019 0806 Last data filed at 12/30/2019 0243 Gross per 24 hour  Intake 720 ml  Output 900 ml  Net -180 ml   There were no vitals filed for this visit.  Examination:  General exam: Appears calm and comfortable  Respiratory system: Clear to auscultation. Respiratory effort normal. Cardiovascular system: S1 & S2 heard, RRR. No JVD, murmurs, rubs, gallops or clicks. No pedal edema. Gastrointestinal system: Abdomen is nondistended, soft and nontender. No organomegaly or masses felt. Normal bowel sounds heard. Central nervous system: Alert and oriented. No focal neurological deficits. Extremities: Symmetric 5 x 5 power. Skin: Left arm with swelling and redness in the antecubital area   Data Reviewed: I have personally reviewed following labs and imaging studies  CBC: Recent Labs  Lab 12/29/19 1451  WBC 25.9*  NEUTROABS 22.2*  HGB 13.5  HCT 40.9  MCV 88.5  PLT 123XX123*   Basic Metabolic Panel: Recent Labs  Lab 12/29/19 1451  NA 132*  K 3.8  CL 94*  CO2 25  GLUCOSE 138*  BUN 11  CREATININE 0.73  CALCIUM 9.7   GFR: CrCl cannot be calculated (Unknown ideal weight.). Liver Function Tests: Recent Labs  Lab 12/29/19 1451  AST 70*  ALT 73*  ALKPHOS 108  BILITOT 0.8  PROT 8.5*  ALBUMIN 3.8   No results for input(s): LIPASE, AMYLASE in the last 168 hours. No results for input(s): AMMONIA in the last 168 hours. Coagulation  Profile: No results for input(s): INR, PROTIME in the last 168 hours. Cardiac Enzymes: No results for input(s): CKTOTAL, CKMB, CKMBINDEX, TROPONINI in the last 168 hours. BNP (last 3 results) No results for input(s): PROBNP in the last 8760 hours. HbA1C: No results for input(s): HGBA1C in the last 72 hours. CBG: No results for input(s): GLUCAP in the last 168 hours. Lipid Profile: No results for input(s): CHOL, HDL, LDLCALC, TRIG, CHOLHDL, LDLDIRECT in the  last 72 hours. Thyroid Function Tests: No results for input(s): TSH, T4TOTAL, FREET4, T3FREE, THYROIDAB in the last 72 hours. Anemia Panel: No results for input(s): VITAMINB12, FOLATE, FERRITIN, TIBC, IRON, RETICCTPCT in the last 72 hours. Sepsis Labs: Recent Labs  Lab 12/29/19 1410  LATICACIDVEN 1.7    Recent Results (from the past 240 hour(s))  Culture, blood (routine x 2)     Status: None (Preliminary result)   Collection Time: 12/29/19  2:10 PM   Specimen: BLOOD RIGHT ARM  Result Value Ref Range Status   Specimen Description   Final    BLOOD RIGHT ARM Performed at [facility], [place] [address] [place], [place] [address]    Special Requests   Final    BOTTLES DRAWN AEROBIC AND ANAEROBIC Blood Culture adequate volume Performed at [facility] [address], [place], [place] [address]    Culture   Final    NO GROWTH < 12 HOURS Performed at [facility] [address], [place], [place] [address]    Report Status PENDING  Incomplete  Respiratory Panel by RT PCR (Flu A&B, Covid) - Nasopharyngeal Swab     Status: None   Collection Time: 12/29/19  8:51 PM   Specimen: Nasopharyngeal Swab  Result Value Ref Range Status   SARS Coronavirus 2 by RT PCR NEGATIVE NEGATIVE Final    Comment: (NOTE) SARS-CoV-2 target nucleic acids are NOT DETECTED. The SARS-CoV-2 RNA is generally detectable in upper respiratoy specimens during the acute phase of infection. The lowest concentration of SARS-CoV-2 viral copies this assay can detect is 131 copies/mL. A negative result does not preclude SARS-Cov-2 infection and should not be used as the sole basis for treatment or other patient management decisions. A negative result may occur with  improper specimen collection/handling, submission of specimen other than nasopharyngeal swab, presence of viral mutation(s) within the areas targeted by this assay, and inadequate number of viral copies (<131 copies/mL). A  negative result must be combined with clinical observations, patient history, and epidemiological information. The expected result is Negative. Fact Sheet for Patients:  PinkCheek.be Fact Sheet for Healthcare Providers:  GravelBags.it This test is not yet ap proved or cleared by the [place] FDA and  has been authorized for detection and/or diagnosis of SARS-CoV-2 by FDA under an Emergency Use Authorization (EUA). This EUA will remain  in effect (meaning this test can be used) for the duration of the COVID-19 declaration under Section 564(b)(1) of the Act, 21 U.S.C. section 360bbb-3(b)(1), unless the authorization is terminated or revoked sooner.    Influenza A by PCR NEGATIVE NEGATIVE Final   Influenza B by PCR NEGATIVE NEGATIVE Final    Comment: (NOTE) The Xpert Xpress SARS-CoV-2/FLU/RSV assay is intended as an aid in  the diagnosis of influenza from Nasopharyngeal swab specimens and  should not be used as a sole basis for treatment. Nasal washings and  aspirates are unacceptable for Xpert Xpress SARS-CoV-2/FLU/RSV  testing. Fact Sheet for Patients: PinkCheek.be Fact Sheet for Healthcare Providers: GravelBags.it This  test is not yet approved or cleared by the [place] and  has been authorized for detection and/or diagnosis of SARS-CoV-2 by  FDA under an Emergency Use Authorization (EUA). This EUA will remain  in effect (meaning this test can be used) for the duration of the  Covid-19 declaration under Section 564(b)(1) of the Act, 21  U.S.C. section 360bbb-3(b)(1), unless the authorization is  terminated or revoked. Performed at [facility], [place] [address] [place], [place] [address]          Radiology Studies: No results found.      Scheduled Meds: . enoxaparin (LOVENOX) injection  40 mg Subcutaneous QHS    Continuous Infusions: . sodium chloride 100 mL/hr at 12/29/19 2224  . ceFEPime (MAXIPIME) IV 2 g (12/30/19 0104)  . vancomycin 1,000 mg (12/29/19 2342)     LOS: 1 day    Time spent: 35 minutes    Belkys A Regalado, MD [facility]   If 7PM-7AM, please contact night-coverage [email or web address]  12/30/2019, 8:06 AM

## 2019-12-30 NOTE — Progress Notes (Signed)
Pharmacy Antibiotic Note  Calvin Sanchez is a 29 y.o. male admitted on 12/29/2019 with cellulitis.  Pharmacy has been consulted for vancomycin + cefepime dosing.  Pt is a 75 yoM presenting with left arm swelling. PMH significant for IVDU, reports using dirty needles. Pt initially seen on 4/21 and left AMA, returned later that evening. Broad spectrum antibiotics initiated.   Today, 12/30/19  WBC 25.9  SCr 0.73, CrCl =. WNL  Lactate 1.7  Afebrile  Pt reported weight of 150 pounds. Ht/wt order entered  Day #1 full dose ABX.   Plan:  Cefepime 2 g IV q8h  Slightly decrease vancomycin dose to 750 mg IV q8h  Goal vancomycin AUC 400-550 or VT 15-20  Follow renal function and culture data. Check vancomycin levels once at steady state if indicated     Temp (24hrs), Avg:98.5 F (36.9 C), Min:98.2 F (36.8 C), Max:98.9 F (37.2 C)  Recent Labs  Lab 12/29/19 1410 12/29/19 1451  WBC  --  25.9*  CREATININE  --  0.73  LATICACIDVEN 1.7  --     CrCl cannot be calculated (Unknown ideal weight.).    No Known Allergies  Antimicrobials this admission: vancomycin 4/21 >>  cefepime 4/21 >>   Dose adjustments this admission: 4/22 vancomycin 1 g IV q8h --> 750 mg IV q8h  Microbiology results: 4/21 BCx: ngtd 4/21 MRSA PCR:   Thank you for allowing pharmacy to be a part of this patient's care.  Lenis Noon, PharmD 12/30/2019 9:25 AM

## 2019-12-30 NOTE — Progress Notes (Signed)
Pt has arrived from ED, rates pain 9/10 in left arm. His only request is something to eat and wanting to sleep as he is starving & exhausted. Oriented to the room , explained meds & plan for this shift.

## 2019-12-30 NOTE — TOC Initial Note (Signed)
Transition of Care [facility]) - Initial/Assessment Note    Patient Details  Name: Calvin Sanchez MRN: [id number] Date of Birth: Feb 28, 1991  [facility] CM/SW Contact:    Trish Mage, LCSW Phone Number: 12/30/2019, 3:09 PM  Clinical Narrative:   Patient seen in follow up to physician consult re: substance abuse. Calvin Sanchez was minimally engaged, admitted to IV drug use, expressed no motivation for cessation, but did express interest in clean needle exchange program.  I printed out information for him, and called to make sure I was giving him accurate information. He has been living at the [employer] at [address] in [place] for the past 3 months or so, has no transportation, asked for help with a ride getting home at d/c.  States he has money for food and rent, but is not working and chose not to divulge his source of income.  No further needs identified. TOC will continue to follow during the course of hospitalization.               Expected Discharge Plan: Home/Self Care Barriers to Discharge: No Barriers Identified   Patient Goals and CMS Choice        Expected Discharge Plan and Services Expected Discharge Plan: Home/Self Care       Living arrangements for the past 2 months: Hotel/Motel                                      Prior Living Arrangements/Services Living arrangements for the past 2 months: Hotel/Motel Lives with:: Self Patient language and need for interpreter reviewed:: Yes Do you feel safe going back to the place where you live?: Yes      Need for Family Participation in Patient Care: No (Comment) Care giver support system in place?: No (comment)   Criminal Activity/Legal Involvement Pertinent to Current Situation/Hospitalization: No - Comment as needed  Activities of Daily Living Home Assistive Devices/Equipment: Eyeglasses ADL Screening (condition at time of admission) Patient's cognitive ability adequate to safely  complete daily activities?: Yes Is the patient deaf or have difficulty hearing?: No Does the patient have difficulty seeing, even when wearing glasses/contacts?: No Does the patient have difficulty concentrating, remembering, or making decisions?: No Patient able to express need for assistance with ADLs?: Yes Does the patient have difficulty dressing or bathing?: No Independently performs ADLs?: Yes (appropriate for developmental age) Does the patient have difficulty walking or climbing stairs?: No Weakness of Legs: None Weakness of Arms/Hands: None  Permission Sought/Granted                  Emotional Assessment Appearance:: Appears stated age Attitude/Demeanor/Rapport: Engaged Affect (typically observed): Appropriate Orientation: : Oriented to Self, Oriented to Place, Oriented to  Time, Oriented to Situation Alcohol / Substance Use: Illicit Drugs Psych Involvement: No (comment)  Admission diagnosis:  Substance abuse ([place]) [F19.10] Cellulitis of left upper extremity [L03.114] Left arm cellulitis [L03.114] Patient Active Problem List   Diagnosis Date Noted  . Cellulitis of arm 12/29/2019  . Polysubstance abuse ([place]) 12/29/2019  . IVDU (intravenous drug user) 12/29/2019  . Sepsis ([place]) 12/29/2019  . Leukocytosis 12/29/2019  . Thrombocytosis ([place]) 12/29/2019  . Abnormal LFTs 12/29/2019  . Hyperglycemia 12/29/2019  . Left arm cellulitis 12/29/2019   PCP:  Patient, No Pcp Per Pharmacy:   [id number] - [place], [place]  [address] [place] [address] Phone: [phone] Fax: [phone]     Social Determinants of Health (SDOH) Interventions    Readmission Risk Interventions No flowsheet data found.

## 2019-12-31 MED ORDER — CEPHALEXIN 500 MG PO CAPS: 500.0000 mg | ORAL_CAPSULE | Freq: Two times a day (BID) | ORAL | 0 refills | Status: AC

## 2019-12-31 MED ORDER — CEPHALEXIN 500 MG PO CAPS: 500.0000 mg | ORAL_CAPSULE | Freq: Two times a day (BID) | ORAL | 0 refills | Status: DC

## 2019-12-31 MED ORDER — SULFAMETHOXAZOLE-TRIMETHOPRIM 800-160 MG PO TABS
1.0000 | ORAL_TABLET | Freq: Two times a day (BID) | ORAL | 0 refills | Status: DC
Start: 2019-12-31 — End: 2021-03-26

## 2019-12-31 MED ORDER — SULFAMETHOXAZOLE-TRIMETHOPRIM 800-160 MG PO TABS
1.0000 | ORAL_TABLET | Freq: Two times a day (BID) | ORAL | 0 refills | Status: DC
Start: 2019-12-31 — End: 2019-12-31

## 2019-12-31 NOTE — Progress Notes (Signed)
Called to patient's room.  He let me know that he needs to leave the hospital, he stated that he has no support system here and that he has family coming from [place] to pick him up.  Educated that patient would need to sign himself out AMA, he voiced understanding.  MD notified and went to room to talk to patient.  AMA paper signed and put in chart.  IV removed.

## 2019-12-31 NOTE — Discharge Summary (Signed)
Physician Discharge Summary  WILLE FORDHAM [id number] DOB: January 06, 1991 DOA: 12/29/2019  PCP: Patient, No Pcp Per  Admit date: 12/29/2019 Discharge date: 12/31/2019  Admitted From: Home  Disposition:  Left AMA.   Recommendations for Outpatient Follow-up:  1. Further care of cellulitis.     Discharge Condition: Stable.  CODE STATUS: Full code Diet recommendation: Heart Healthy   Brief/Interim Summary: 30 year old with past medical history significant for IV drug abuse who presented with left forearm and antecubital fossa cellulitis.  Patient was admitted to the hospital for IV antibiotics.  1-cellulitis of the left upper extremity: The patient with a history of IV drug use.  Continue with IV antibiotics for another 24 hours.  ultrasound negative  for abscess.  Follow blood cultures. No growth to date.  Patient left AMA. I will provide oral antibiotics at discharge. Explain to patient I would have prefer he stay in the hospital for IV antibiotics.   2-IV drug abuse: Counseling provided to patient.  Social worker consulted  3-leukocytosis: Continue with IV antibiotics. Follow trend 4-hyponatremia: Continue with IV fluids   Discharge Diagnoses:  Principal Problem:   Left arm cellulitis Active Problems:   Polysubstance abuse ([place])   IVDU (intravenous drug user)   Thrombocytosis ([place])    Discharge Instructions   Allergies as of 12/31/2019   No Known Allergies     Medication List    TAKE these medications   cephALEXin 500 MG capsule Commonly known as: KEFLEX Take 1 capsule (500 mg total) by mouth 2 (two) times daily for 5 days.   sulfamethoxazole-trimethoprim 800-160 MG tablet Commonly known as: BACTRIM DS Take 1 tablet by mouth 2 (two) times daily.       No Known Allergies  Consultations:  none   Procedures/Studies: [place] LT UPPER EXTREM LTD SOFT TISSUE NON VASCULAR  Result Date: 12/30/2019 CLINICAL DATA:  Redness, pain and swelling in the left  upper arm in an IV drug abuser. EXAM: ULTRASOUND left UPPER EXTREMITY LIMITED TECHNIQUE: Ultrasound examination of the upper extremity soft tissues was performed in the area of clinical concern. COMPARISON:  None. FINDINGS: Scanning was directed toward the region of concern. Extensive subcutaneous edema is identified. No focal fluid collection is seen. IMPRESSION: Subcutaneous edema compatible with cellulitis. No abscess is identified. Electronically Signed   By: Inge Rise M.D.   On: 12/30/2019 14:41      Subjective: Wants to leave. He will go to [place] with his brother  Discharge Exam: Vitals:   12/30/19 1629 12/30/19 1827  BP: (!) 155/105 (!) 137/91  Pulse: 77 73  Resp: (!) 22   Temp: 99.1 F (37.3 C)   SpO2: 100%      General: Pt is alert, awake, not in acute distress Cardiovascular: RRR, S1/S2 +, no rubs, no gallops Respiratory: CTA bilaterally, no wheezing, no rhonchi Abdominal: Soft, NT, ND, bowel sounds + Extremities: no edema, no cyanosis    The results of significant diagnostics from this hospitalization (including imaging, microbiology, ancillary and laboratory) are listed below for reference.     Microbiology: Recent Results (from the past 240 hour(s))  Culture, blood (routine x 2)     Status: None (Preliminary result)   Collection Time: 12/29/19  2:10 PM   Specimen: BLOOD RIGHT ARM  Result Value Ref Range Status   Specimen Description   Final    BLOOD RIGHT ARM Performed at [facility], [address] [place], [place] [address]    Special Requests   Final  BOTTLES DRAWN AEROBIC AND ANAEROBIC Blood Culture adequate volume Performed at [facility] [address] [place], [place] [address]    Culture   Final    NO GROWTH < 12 HOURS Performed at [facility] [address] [place], [place] [address]    Report Status PENDING  Incomplete  Respiratory Panel by RT PCR (Flu A&B, Covid) - Nasopharyngeal Swab      Status: None   Collection Time: 12/29/19  8:51 PM   Specimen: Nasopharyngeal Swab  Result Value Ref Range Status   SARS Coronavirus 2 by RT PCR NEGATIVE NEGATIVE Final    Comment: (NOTE) SARS-CoV-2 target nucleic acids are NOT DETECTED. The SARS-CoV-2 RNA is generally detectable in upper respiratoy specimens during the acute phase of infection. The lowest concentration of SARS-CoV-2 viral copies this assay can detect is 131 copies/mL. A negative result does not preclude SARS-Cov-2 infection and should not be used as the sole basis for treatment or other patient management decisions. A negative result may occur with  improper specimen collection/handling, submission of specimen other than nasopharyngeal swab, presence of viral mutation(s) within the areas targeted by this assay, and inadequate number of viral copies (<131 copies/mL). A negative result must be combined with clinical observations, patient history, and epidemiological information. The expected result is Negative. Fact Sheet for Patients:  PinkCheek.be Fact Sheet for Healthcare Providers:  GravelBags.it This test is not yet ap proved or cleared by the [place] FDA and  has been authorized for detection and/or diagnosis of SARS-CoV-2 by FDA under an Emergency Use Authorization (EUA). This EUA will remain  in effect (meaning this test can be used) for the duration of the COVID-19 declaration under Section 564(b)(1) of the Act, 21 U.S.C. section 360bbb-3(b)(1), unless the authorization is terminated or revoked sooner.    Influenza A by PCR NEGATIVE NEGATIVE Final   Influenza B by PCR NEGATIVE NEGATIVE Final    Comment: (NOTE) The Xpert Xpress SARS-CoV-2/FLU/RSV assay is intended as an aid in  the diagnosis of influenza from Nasopharyngeal swab specimens and  should not be used as a sole basis for treatment. Nasal washings and  aspirates are unacceptable for  Xpert Xpress SARS-CoV-2/FLU/RSV  testing. Fact Sheet for Patients: PinkCheek.be Fact Sheet for Healthcare Providers: GravelBags.it This test is not yet approved or cleared by the [place] FDA and  has been authorized for detection and/or diagnosis of SARS-CoV-2 by  FDA under an Emergency Use Authorization (EUA). This EUA will remain  in effect (meaning this test can be used) for the duration of the  Covid-19 declaration under Section 564(b)(1) of the Act, 21  U.S.C. section 360bbb-3(b)(1), unless the authorization is  terminated or revoked. Performed at [facility], [place] [address] [place], [place] [address]   MRSA PCR Screening     Status: None   Collection Time: 12/30/19  6:50 AM   Specimen: Nasopharyngeal  Result Value Ref Range Status   MRSA by PCR NEGATIVE NEGATIVE Final    Comment:        The GeneXpert MRSA Assay (FDA approved for NASAL specimens only), is one component of a comprehensive MRSA colonization surveillance program. It is not intended to diagnose MRSA infection nor to guide or monitor treatment for MRSA infections. Performed at [facility], [place] [address] [place], [place] [address]      Labs: BNP (last 3 results) No results for input(s): BNP in the last 8760 hours. Basic Metabolic Panel:  Recent Labs  Lab 12/29/19 1451  NA 132*  K 3.8  CL 94*  CO2 25  GLUCOSE 138*  BUN 11  CREATININE 0.73  CALCIUM 9.7   Liver Function Tests: Recent Labs  Lab 12/29/19 1451  AST 70*  ALT 73*  ALKPHOS 108  BILITOT 0.8  PROT 8.5*  ALBUMIN 3.8   No results for input(s): LIPASE, AMYLASE in the last 168 hours. No results for input(s): AMMONIA in the last 168 hours. CBC: Recent Labs  Lab 12/29/19 1451  WBC 25.9*  NEUTROABS 22.2*  HGB 13.5  HCT 40.9  MCV 88.5  PLT 419*   Cardiac Enzymes: No results for input(s): CKTOTAL, CKMB, CKMBINDEX, TROPONINI  in the last 168 hours. BNP: Invalid input(s): POCBNP CBG: No results for input(s): GLUCAP in the last 168 hours. D-Dimer No results for input(s): DDIMER in the last 72 hours. Hgb A1c No results for input(s): HGBA1C in the last 72 hours. Lipid Profile No results for input(s): CHOL, HDL, LDLCALC, TRIG, CHOLHDL, LDLDIRECT in the last 72 hours. Thyroid function studies No results for input(s): TSH, T4TOTAL, T3FREE, THYROIDAB in the last 72 hours.  Invalid input(s): FREET3 Anemia work up No results for input(s): VITAMINB12, FOLATE, FERRITIN, TIBC, IRON, RETICCTPCT in the last 72 hours. Urinalysis    Component Value Date/Time   COLORURINE YELLOW 12/29/2019 1451   APPEARANCEUR CLEAR 12/29/2019 1451   LABSPEC 1.015 12/29/2019 1451   PHURINE 7.0 12/29/2019 1451   GLUCOSEU NEGATIVE 12/29/2019 1451   HGBUR NEGATIVE 12/29/2019 1451   BILIRUBINUR NEGATIVE 12/29/2019 1451   KETONESUR 5 (A) 12/29/2019 1451   PROTEINUR NEGATIVE 12/29/2019 1451   NITRITE NEGATIVE 12/29/2019 1451   LEUKOCYTESUR NEGATIVE 12/29/2019 1451   Sepsis Labs Invalid input(s): PROCALCITONIN,  WBC,  LACTICIDVEN Microbiology Recent Results (from the past 240 hour(s))  Culture, blood (routine x 2)     Status: None (Preliminary result)   Collection Time: 12/29/19  2:10 PM   Specimen: BLOOD RIGHT ARM  Result Value Ref Range Status   Specimen Description   Final    BLOOD RIGHT ARM Performed at [facility], [place] [address], [place], [place] [address]    Special Requests   Final    BOTTLES DRAWN AEROBIC AND ANAEROBIC Blood Culture adequate volume Performed at [facility] [address] [place], [place] [address]    Culture   Final    NO GROWTH < 12 HOURS Performed at [facility] [address], [place], [place] [address]    Report Status PENDING  Incomplete  Respiratory Panel by RT PCR (Flu A&B, Covid) - Nasopharyngeal Swab     Status: None   Collection Time: 12/29/19  8:51 PM    Specimen: Nasopharyngeal Swab  Result Value Ref Range Status   SARS Coronavirus 2 by RT PCR NEGATIVE NEGATIVE Final    Comment: (NOTE) SARS-CoV-2 target nucleic acids are NOT DETECTED. The SARS-CoV-2 RNA is generally detectable in upper respiratoy specimens during the acute phase of infection. The lowest concentration of SARS-CoV-2 viral copies this assay can detect is 131 copies/mL. A negative result does not preclude SARS-Cov-2 infection and should not be used as the sole basis for treatment or other patient management decisions. A negative result may occur with  improper specimen collection/handling, submission of specimen other than nasopharyngeal swab, presence of viral mutation(s) within the areas targeted by this assay, and inadequate number of viral copies (<131 copies/mL). A negative result must be combined with clinical observations,  patient history, and epidemiological information. The expected result is Negative. Fact Sheet for Patients:  PinkCheek.be Fact Sheet for Healthcare Providers:  GravelBags.it This test is not yet ap proved or cleared by the [place] FDA and  has been authorized for detection and/or diagnosis of SARS-CoV-2 by FDA under an Emergency Use Authorization (EUA). This EUA will remain  in effect (meaning this test can be used) for the duration of the COVID-19 declaration under Section 564(b)(1) of the Act, 21 U.S.C. section 360bbb-3(b)(1), unless the authorization is terminated or revoked sooner.    Influenza A by PCR NEGATIVE NEGATIVE Final   Influenza B by PCR NEGATIVE NEGATIVE Final    Comment: (NOTE) The Xpert Xpress SARS-CoV-2/FLU/RSV assay is intended as an aid in  the diagnosis of influenza from Nasopharyngeal swab specimens and  should not be used as a sole basis for treatment. Nasal washings and  aspirates are unacceptable for Xpert Xpress SARS-CoV-2/FLU/RSV  testing. Fact Sheet  for Patients: PinkCheek.be Fact Sheet for Healthcare Providers: GravelBags.it This test is not yet approved or cleared by the [place] FDA and  has been authorized for detection and/or diagnosis of SARS-CoV-2 by  FDA under an Emergency Use Authorization (EUA). This EUA will remain  in effect (meaning this test can be used) for the duration of the  Covid-19 declaration under Section 564(b)(1) of the Act, 21  U.S.C. section 360bbb-3(b)(1), unless the authorization is  terminated or revoked. Performed at [facility], [place] [address], [place], [place] [address]   MRSA PCR Screening     Status: None   Collection Time: 12/30/19  6:50 AM   Specimen: Nasopharyngeal  Result Value Ref Range Status   MRSA by PCR NEGATIVE NEGATIVE Final    Comment:        The GeneXpert MRSA Assay (FDA approved for NASAL specimens only), is one component of a comprehensive MRSA colonization surveillance program. It is not intended to diagnose MRSA infection nor to guide or monitor treatment for MRSA infections. Performed at [facility], [place] [address] [place], [place] [address]      Time coordinating discharge: 40 minutes  SIGNED:   Elmarie Shiley, MD  [facility]

## 2019-12-31 NOTE — Progress Notes (Signed)
20:30 Patient had reported recent use of .5 Gm/day use of Heroin and fentanyl.  COWS score 5.  Had obtained a one time order for imodium.  Observed that Urine tox screen was negative for opiates. Medicated for report of nausea this shift. Patient has been restful and has refused having his vital signs taken this shift and has requested lab to draw blood later.  No further complaints or acute pain observed this shift.

## 2019-12-31 NOTE — TOC Transition Note (Signed)
Transition of Care [facility]) - CM/SW Discharge Note   Patient Details  Name: Calvin Sanchez MRN: [id number] Date of Birth: Jul 16, 1991  Transition of Care [facility]) CM/SW Contact:  Trish Mage, LCSW Phone Number: 12/31/2019, 9:57 AM   Clinical Narrative:   Patient signed out AMA. Given bus pass, slippers. TOC sign off.    Final next level of care: Home/Self Care Barriers to Discharge: No Barriers Identified   Patient Goals and CMS Choice        Discharge Placement                       Discharge Plan and Services                                     Social Determinants of Health (SDOH) Interventions     Readmission Risk Interventions No flowsheet data found.

## 2020-01-03 LAB — CULTURE, BLOOD (ROUTINE X 2)
Culture: NO GROWTH
Special Requests: ADEQUATE

## 2020-01-25 ENCOUNTER — Emergency Department (HOSPITAL_COMMUNITY)
Admission: EM | Admit: 2020-01-25 | Discharge: 2020-01-25 | Disposition: A | Discharge status: Home / Self Care | Payer: Self-pay | Attending: Emergency Medicine | Admitting: Emergency Medicine

## 2020-01-25 ENCOUNTER — Encounter (HOSPITAL_COMMUNITY): Payer: Self-pay

## 2020-01-25 ENCOUNTER — Other Ambulatory Visit: Payer: Self-pay

## 2020-01-25 DIAGNOSIS — T44901A Poisoning by unspecified drugs primarily affecting the autonomic nervous system, accidental (unintentional), initial encounter: Secondary | ICD-10-CM | POA: Insufficient documentation

## 2020-01-25 DIAGNOSIS — T50901A Poisoning by unspecified drugs, medicaments and biological substances, accidental (unintentional), initial encounter: Secondary | ICD-10-CM

## 2020-01-25 DIAGNOSIS — F19129 Other psychoactive substance abuse with intoxication, unspecified: Secondary | ICD-10-CM | POA: Insufficient documentation

## 2020-01-25 DIAGNOSIS — R4182 Altered mental status, unspecified: Secondary | ICD-10-CM | POA: Insufficient documentation

## 2020-01-25 LAB — CBC WITH DIFFERENTIAL/PLATELET
Abs Immature Granulocytes: 0.05 10*3/uL (ref 0.00–0.07)
Basophils Absolute: 0 10*3/uL (ref 0.0–0.1)
Basophils Relative: 1 %
Eosinophils Absolute: 0.3 10*3/uL (ref 0.0–0.5)
Eosinophils Relative: 3 %
HCT: 35.9 % — ABNORMAL LOW (ref 39.0–52.0)
Hemoglobin: 11.4 g/dL — ABNORMAL LOW (ref 13.0–17.0)
Immature Granulocytes: 1 %
Lymphocytes Relative: 24 %
Lymphs Abs: 2.1 10*3/uL (ref 0.7–4.0)
MCH: 28.4 pg (ref 26.0–34.0)
MCHC: 31.8 g/dL (ref 30.0–36.0)
MCV: 89.5 fL (ref 80.0–100.0)
Monocytes Absolute: 0.6 10*3/uL (ref 0.1–1.0)
Monocytes Relative: 7 %
Neutro Abs: 5.7 10*3/uL (ref 1.7–7.7)
Neutrophils Relative %: 64 %
Platelets: 373 10*3/uL (ref 150–400)
RBC: 4.01 MIL/uL — ABNORMAL LOW (ref 4.22–5.81)
RDW: 12.9 % (ref 11.5–15.5)
WBC: 8.7 10*3/uL (ref 4.0–10.5)
nRBC: 0 % (ref 0.0–0.2)

## 2020-01-25 LAB — BASIC METABOLIC PANEL
Anion gap: 7 (ref 5–15)
BUN: 11 mg/dL (ref 6–20)
CO2: 24 mmol/L (ref 22–32)
Calcium: 8.9 mg/dL (ref 8.9–10.3)
Chloride: 108 mmol/L (ref 98–111)
Creatinine, Ser: 0.67 mg/dL (ref 0.61–1.24)
GFR calc Af Amer: >60 mL/min (ref >60)
GFR calc non Af Amer: >60 mL/min (ref >60)
Glucose, Bld: 121 mg/dL — ABNORMAL HIGH (ref 70–99)
Potassium: 4 mmol/L (ref 3.5–5.1)
Sodium: 139 mmol/L (ref 135–145)

## 2020-01-25 LAB — ETHANOL: Alcohol, Ethyl (B): <10 mg/dL (ref <10)

## 2020-01-25 MED ORDER — SODIUM CHLORIDE 0.9 % IV BOLUS
1000.0000 mL | Freq: Once | INTRAVENOUS | Status: AC
  Administered 2020-01-25: 1000 mL via INTRAVENOUS

## 2020-01-25 MED ORDER — NALOXONE HCL 0.4 MG/ML IJ SOLN
0.4000 mg | Freq: Once | INTRAMUSCULAR | Status: AC
  Administered 2020-01-25: 0.4 mg via INTRAVENOUS
  Filled 2020-01-25: qty 1

## 2020-01-25 NOTE — Patient Outreach (Signed)
ED Peer Support Specialist Patient Intake (Complete at intake & 30-60 Day Follow-up)  Name: Calvin Sanchez  MRN: [id number]  Age: 29 y.o.   Date of Admission: 01/25/2020  Intake: Initial Comments:      Primary Reason Admitted: Altered Mental Status   Lab values: Alcohol/ETOH: Positive Positive UDS? Yes Amphetamines: Yes Barbiturates: No Benzodiazepines: No Cocaine: Yes Opiates: No Cannabinoids: No  Demographic information: Gender: Male Ethnicity: African American Marital Status: Single Insurance Status: Other (comment) Ecologist (Work Neurosurgeon, Physicist, medical, etc.: No Lives with: Partner/Spouse Living situation: Homeless  Reported Patient History: Patient reported health conditions: None, Depression Patient aware of HIV and hepatitis status: No  In past year, has patient visited ED for any reason? No  Number of ED visits:    Reason(s) for visit:    In past year, has patient been hospitalized for any reason? No  Number of hospitalizations:    Reason(s) for hospitalization:    In past year, has patient been arrested? No  Number of arrests:    Reason(s) for arrest:    In past year, has patient been incarcerated? No  Number of incarcerations:    Reason(s) for incarceration:    In past year, has patient received medication-assisted treatment? Yes, [facility]  In past year, patient received the following treatments:    In past year, has patient received any harm reduction services? No  Did this include any of the following?    In past year, has patient received care from a mental health provider for diagnosis other than SUD? No  In past year, is this first time patient has overdosed? No  Number of past overdoses:    In past year, is this first time patient has been hospitalized for an overdose? No  Number of hospitalizations for overdose(s):    Is patient currently receiving treatment for a mental  health diagnosis? No  Patient reports experiencing difficulty participating in SUD treatment: No    Most important reason(s) for this difficulty?    Has patient received prior services for treatment? No  In past, patient has received services from following agencies:    Plan of Care:  Suggested follow up at these agencies/treatment centers: Other (comment)  Other information: [facility]PSS met with Pt an had to prompt Pt several times to wake up, Pt stated that he has a plan to attend [facility] in [place] continues to speak with Pt to try to gain information to better assist Pt. CPSS will resource package for Pt an contact information for Pt to follow up with [facility].    Aaron Edelman Maureena Dabbs, [place]  01/25/2020 2:08 PM

## 2020-01-25 NOTE — ED Provider Notes (Signed)
[facility] DEPT Provider Note   CSN: [id number] Arrival date & time: 01/25/20  1328     History Chief Complaint  Patient presents with  . Altered Mental Status    Calvin Sanchez is a 29 y.o. male.  The history is provided by the patient.  Altered Mental Status Presenting symptoms: partial responsiveness   Severity:  Mild Most recent episode:  Today Episode history:  Single Timing:  Constant Chronicity:  New Context: drug use (EMS states empty syringe found in pocket, history of opoid abuse per chart. )   Associated symptoms: no abdominal pain, normal movement, no agitation, no fever, no palpitations, no rash, no seizures and no vomiting        Past Medical History:  Diagnosis Date  . Substance abuse [facility])     Patient Active Problem List   Diagnosis Date Noted  . Cellulitis of arm 12/29/2019  . Polysubstance abuse ([place]) 12/29/2019  . IVDU (intravenous drug user) 12/29/2019  . Sepsis ([place]) 12/29/2019  . Leukocytosis 12/29/2019  . Thrombocytosis ([place]) 12/29/2019  . Abnormal LFTs 12/29/2019  . Hyperglycemia 12/29/2019  . Left arm cellulitis 12/29/2019    Past Surgical History:  Procedure Laterality Date  . KNEE SURGERY Left        History reviewed. No pertinent family history.  Social History   Tobacco Use  . Smoking status: Current Every Day Smoker    Types: Cigars  . Smokeless tobacco: Never Used  Substance Use Topics  . Alcohol use: Yes    Comment: Once a week. Last drink: yesterday   . Drug use: Yes    Types: Cocaine, Methamphetamines    Comment: Heronin. Last used: PTA. Cocaine and Meth last used this weekend.     Home Medications Prior to Admission medications   Medication Sig Start Date End Date Taking? Authorizing Provider  sulfamethoxazole-trimethoprim (BACTRIM DS) 800-160 MG tablet Take 1 tablet by mouth 2 (two) times daily. Patient not taking: Reported on 01/25/2020 12/31/19   Elmarie Shiley, MD     Allergies    Patient has no known allergies.  Review of Systems   Review of Systems  Constitutional: Negative for chills and fever.  HENT: Negative for ear pain and sore throat.   Eyes: Negative for pain and visual disturbance.  Respiratory: Negative for cough and shortness of breath.   Cardiovascular: Negative for chest pain and palpitations.  Gastrointestinal: Negative for abdominal pain and vomiting.  Genitourinary: Negative for dysuria and hematuria.  Musculoskeletal: Negative for arthralgias and back pain.  Skin: Negative for color change and rash.  Neurological: Negative for seizures and syncope.  Psychiatric/Behavioral: Negative for agitation.  All other systems reviewed and are negative.   Physical Exam Updated Vital Signs  ED Triage Vitals  Enc Vitals Group     BP 01/25/20 1348 133/84     Pulse Rate 01/25/20 1348 100     Resp 01/25/20 1348 12     Temp 01/25/20 1348 97.7 F (36.5 C)     Temp Source 01/25/20 1348 Axillary     SpO2 01/25/20 1348 100 %     Weight --      Height --      Head Circumference --      Peak Flow --      Pain Score 01/25/20 1339 0     Pain Loc --      Pain Edu? --      Excl. in [place]? --  Physical Exam Vitals and nursing note reviewed.  Constitutional:      General: He is not in acute distress.    Appearance: He is well-developed. He is not ill-appearing.     Comments: Overall patient is unkempt   HENT:     Head: Normocephalic and atraumatic.  Eyes:     Extraocular Movements: Extraocular movements intact.     Conjunctiva/sclera: Conjunctivae normal.     Comments: Pinpoint pupils bilaterally  Cardiovascular:     Rate and Rhythm: Normal rate and regular rhythm.     Pulses: Normal pulses.     Heart sounds: Normal heart sounds. No murmur.  Pulmonary:     Effort: Pulmonary effort is normal. No respiratory distress.     Breath sounds: Normal breath sounds.  Abdominal:     Palpations: Abdomen is soft.     Tenderness: There  is no abdominal tenderness.  Musculoskeletal:     Cervical back: Neck supple.  Skin:    General: Skin is warm and dry.  Neurological:     General: No focal deficit present.     Mental Status: He is alert and oriented to person, place, and time.     Cranial Nerves: No cranial nerve deficit.     Comments: Patient unsteady on his feet but able to walk around, moves all extremities, appears to have normal strength and sensation, somnolent but easily arousable and conversational     ED Results / Procedures / Treatments   Labs (all labs ordered are listed, but only abnormal results are displayed) Labs Reviewed  BASIC METABOLIC PANEL - Abnormal; Notable for the following components:      Result Value   Glucose, Bld 121 (*)    All other components within normal limits  CBC WITH DIFFERENTIAL/PLATELET - Abnormal; Notable for the following components:   RBC 4.01 (*)    Hemoglobin 11.4 (*)    HCT 35.9 (*)    All other components within normal limits  ETHANOL  CBC WITH DIFFERENTIAL/PLATELET  RAPID URINE DRUG SCREEN, HOSP PERFORMED    EKG None  Radiology No results found.  Procedures Procedures (including critical care time)  Medications Ordered in ED Medications  sodium chloride 0.9 % bolus 1,000 mL (1,000 mLs Intravenous New Bag/Given 01/25/20 1642)    ED Course  I have reviewed the triage vital signs and the nursing notes.  Pertinent labs & imaging results that were available during my care of the patient were reviewed by me and considered in my medical decision making (see chart for details).    MDM Rules/Calculators/A&P                      Calvin Sanchez is a 29 year old male with history of polysubstance abuse who presents to the ED with altered mental status.  Patient with normal vitals.  No fever.  Patient comes from by EMS after hotel employee called them as patient was found staggering around in the hotel parking lot.  EMS found empty syringe in his pocket.   Pinpoint pupils on exam.  Patient was alert and able to talk.  No need for Narcan at the scene.  Patient is somnolent but easily arousable and able to talk.  He still does not admit to opioid use today.  However given his history and physical it seems likely that he is high from opioid use.  Patient states that he is currently homeless and has been staying at a hotel with family.  He has gone to rehab in the past.  Will check basic labs.  Patient will need monitoring to make sure he does not need Narcan.  He is too unsteady on his feet right now.  No signs of trauma on exam.  We will try to get resources for homelessness and substance abuse counseling with peers support and social work consultations.  Will reevaluate.  Electrolytes overall unremarkable.  Patient with good end-tidal CO2.  He is still somnolent but easily arousable.  We will have him further metabolize.  Signed out to Dr. Darl Householder.  No signs of trauma on exam.  Do not believe he needs any head imaging at this time.  However if does not have improvement over the next several hours may need reevaluation at that time.  However suspect patient is high from opioid use.  Please see Dr. Kathleen Lime note for further results, evaluation, disposition of the patient.  This chart was dictated using voice recognition software.  Despite best efforts to proofread,  errors can occur which can change the documentation meaning.    Final Clinical Impression(s) / ED Diagnoses Final diagnoses:  Altered mental status, unspecified altered mental status type    Rx / DC Orders ED Discharge Orders    None       Lennice Sites, DO 01/25/20 1645

## 2020-01-25 NOTE — Progress Notes (Signed)
CSW called [facility] in [place] who stated that as of tonight they do not have beds available for detox and/or SUD tx.    CSW was told to provided pt with the contact information to call Shayla or Judeen Hammans on 01/26/20 at ph: [phone] for assistance with seeking a place on the waiting list.  CSW will continue to follow for D/C needs.  Alphonse Guild. Shauntelle Jamerson  MSW, LCSW, LCAS, CSI Transitions of Care Clinical Social Worker Care Coordination Department Ph: [phone]

## 2020-01-25 NOTE — ED Provider Notes (Signed)
  Physical Exam  BP 130/80 (BP Location: Left Arm)   Pulse (!) 110   Temp 99.8 F (37.7 C) (Oral)   Resp (!) 26   SpO2 99%   Physical Exam  ED Course/Procedures     Procedures  MDM  Patient care assumed at 4:30 pm.  Patient here with presumed heroin overdose.  Patient also may not have a place to stay.  Signout pending reassessment of mental status and social work consult.  10:01 PM Social work saw patient and was able to find him a shelter to stay at. Patient is awake and alert. Stable for discharge to shelter      Drenda Freeze, MD 01/25/20 2201

## 2020-01-25 NOTE — Progress Notes (Addendum)
TOC CM contacted in [facility] in [place] and they do not have any beds at this time. They do assessments daily and may have availability on 01/26/2020, 4-7 day program is a first come, first served program for pt's without insurance. Provided pt with brochure. Jonnie Finner RN CCM, [facility] [place] [facility] [place], [address]   [email or web address]   [phone]

## 2020-01-25 NOTE — ED Triage Notes (Signed)
Pt arrives via [facility] from a hotel. Per EMS: Pt was found staggering in a hotel parking lot. Pt reports heroin use yesterday. Denies drug use today. Pt had an empty syringe in pocket. Pt has mild confusion. Pt is alert. EMS reports pt is unsteady on his feet.

## 2020-01-25 NOTE — Discharge Instructions (Signed)
Avoid using heroin.  See your doctor for follow-up.  Return to ER if you have thoughts of harming yourself or others, lethargy.

## 2020-01-25 NOTE — Progress Notes (Signed)
CSW to offer the following resources to the pt:  [facility] (For outpatient substance use counseling) [address] ([place]) [place], [place] [address] Phone: [phone] Fax: [phone] Please arrive to the walk in clinic between the hours of 12pm to 3pm Monday/Wednesday and Friday for an assessment for medication management, intensive outpatient substance abuse treatment and group and individual therapy.   [facility] (if you want to return for psychiatric medication management.) [address] [place], [place] [address] Phone: [phone] Fax: [phone] Please arrive to the walk-in clinic Monday through Friday from 8-4pm for your assessment for your hospital follow up for medication management and therapy.   [facility] (For doctor's appointments) Address: [place], [place] [address] Phone: [phone] Same-day appointments available for medical needs.  Call [phone] to schedule. Ask for an appointment to see a doctor and ask for assistance with registration for an "[employer]" which will allow one to receive reduced cost medical assistance and low-cost medications.  [facility] (for therapy appointments) [address] [place], [place] [address] Phone: [phone] Crisis Line: Lady Gary: [phone] [place], [place]: [facility]) [email or web address] Address: [place], [place], [place] [address] Phone: [phone] [facility] [place], [place], [place] [address] Phone: [phone] Fax: [phone] Attn: Sophronia Simas  Please arrive for your appointment M-F from 63:30am-9am with your hospital discharge paperwork, your I.D. and proof of insurance if needed.  [facility]              [email or web address] Address: [address] [place],  [address] Phone: [phone]  CSW will continue to follow for D/C needs.  Alphonse Guild. [place]  MSW, LCSW, LCAS, CSI Transitions of Care Clinical Social Worker Care Coordination Department Ph: [phone]

## 2020-01-26 NOTE — Progress Notes (Signed)
CSW received verbal permission to reach out to the [facility] in the area.  CSW called all the male-oriented [employer] in the area and found that the [employer] would see the pt for an interview at 11pm.  CSW met with pt to offer resources, initally pt refused but as session progressed, pt accepted education offered by the pt on making contact, assessing for open beds and requesting an interview/assessment for admission.  CSW contacted Pamala Hurry with the [employer] who found pt a couch to sleep on if the interview did not go well and CSW provided pt with three back-ups at [facility] that could interview the pt on Thursday 01/27/20.   Pt appreciated CSW's efforts and thanked the CSW.  RN/CN updated and agreeable to call pt's taxi prior to pt's 11p interview.  RN provided with voucher.  Please reconsult if future social work needs arise.  CSW signing off, as social work intervention is no longer needed.  Alphonse Guild. Jshon Ibe  MSW, LCSW, LCAS, CSI Transitions of Care Clinical Social Worker Care Coordination Department Ph: [phone]  Alphonse Guild. Ilena Dieckman, LCSW, LCAS, CSI Clinical Social Worker Ph: [phone]

## 2020-02-03 ENCOUNTER — Other Ambulatory Visit: Payer: Self-pay

## 2020-02-03 ENCOUNTER — Encounter (HOSPITAL_COMMUNITY): Payer: Self-pay | Admitting: *Deleted

## 2020-02-03 ENCOUNTER — Emergency Department (HOSPITAL_COMMUNITY)
Admission: EM | Admit: 2020-02-03 | Discharge: 2020-02-04 | Disposition: A | Discharge status: Home / Self Care | Payer: Self-pay | Attending: Emergency Medicine | Admitting: Emergency Medicine

## 2020-02-03 DIAGNOSIS — F320 Major depressive disorder, single episode, mild: Secondary | ICD-10-CM | POA: Insufficient documentation

## 2020-02-03 DIAGNOSIS — D72829 Elevated white blood cell count, unspecified: Secondary | ICD-10-CM | POA: Insufficient documentation

## 2020-02-03 DIAGNOSIS — R45851 Suicidal ideations: Secondary | ICD-10-CM | POA: Insufficient documentation

## 2020-02-03 DIAGNOSIS — Z20822 Contact with and (suspected) exposure to covid-19: Secondary | ICD-10-CM | POA: Insufficient documentation

## 2020-02-03 DIAGNOSIS — F1190 Opioid use, unspecified, uncomplicated: Secondary | ICD-10-CM | POA: Insufficient documentation

## 2020-02-03 DIAGNOSIS — F1590 Other stimulant use, unspecified, uncomplicated: Secondary | ICD-10-CM | POA: Insufficient documentation

## 2020-02-03 DIAGNOSIS — F1910 Other psychoactive substance abuse, uncomplicated: Secondary | ICD-10-CM

## 2020-02-03 HISTORY — DX: Homelessness unspecified: Z59.00

## 2020-02-03 LAB — CBC WITH DIFFERENTIAL/PLATELET
Abs Immature Granulocytes: 0.32 10*3/uL — ABNORMAL HIGH (ref 0.00–0.07)
Basophils Absolute: 0.1 10*3/uL (ref 0.0–0.1)
Basophils Relative: 0 %
Eosinophils Absolute: 0.2 10*3/uL (ref 0.0–0.5)
Eosinophils Relative: 1 %
HCT: 41.3 % (ref 39.0–52.0)
Hemoglobin: 13.4 g/dL (ref 13.0–17.0)
Immature Granulocytes: 1 %
Lymphocytes Relative: 16 %
Lymphs Abs: 3.7 10*3/uL (ref 0.7–4.0)
MCH: 28.9 pg (ref 26.0–34.0)
MCHC: 32.4 g/dL (ref 30.0–36.0)
MCV: 89.2 fL (ref 80.0–100.0)
Monocytes Absolute: 1.7 10*3/uL — ABNORMAL HIGH (ref 0.1–1.0)
Monocytes Relative: 7 %
Neutro Abs: 17.6 10*3/uL — ABNORMAL HIGH (ref 1.7–7.7)
Neutrophils Relative %: 75 %
Platelets: 355 10*3/uL (ref 150–400)
RBC: 4.63 MIL/uL (ref 4.22–5.81)
RDW: 13.2 % (ref 11.5–15.5)
WBC: 23.6 10*3/uL — ABNORMAL HIGH (ref 4.0–10.5)
nRBC: 0 % (ref 0.0–0.2)

## 2020-02-03 LAB — COMPREHENSIVE METABOLIC PANEL
ALT: 62 U/L — ABNORMAL HIGH (ref 0–44)
AST: 42 U/L — ABNORMAL HIGH (ref 15–41)
Albumin: 3.6 g/dL (ref 3.5–5.0)
Alkaline Phosphatase: 92 U/L (ref 38–126)
Anion gap: 10 (ref 5–15)
BUN: 13 mg/dL (ref 6–20)
CO2: 25 mmol/L (ref 22–32)
Calcium: 9.1 mg/dL (ref 8.9–10.3)
Chloride: 103 mmol/L (ref 98–111)
Creatinine, Ser: 0.86 mg/dL (ref 0.61–1.24)
GFR calc Af Amer: >60 mL/min (ref >60)
GFR calc non Af Amer: >60 mL/min (ref >60)
Glucose, Bld: 107 mg/dL — ABNORMAL HIGH (ref 70–99)
Potassium: 3.6 mmol/L (ref 3.5–5.1)
Sodium: 138 mmol/L (ref 135–145)
Total Bilirubin: 0.4 mg/dL (ref 0.3–1.2)
Total Protein: 7.8 g/dL (ref 6.5–8.1)

## 2020-02-03 LAB — SARS CORONAVIRUS 2 BY RT PCR (HOSPITAL ORDER, PERFORMED IN ~~LOC~~ HOSPITAL LAB): SARS Coronavirus 2: NEGATIVE

## 2020-02-03 LAB — ETHANOL: Alcohol, Ethyl (B): <10 mg/dL (ref <10)

## 2020-02-03 MED ORDER — DICYCLOMINE HCL 20 MG PO TABS: 20.0000 mg | ORAL_TABLET | Freq: Four times a day (QID) | ORAL | Status: DC | PRN

## 2020-02-03 MED ORDER — METHOCARBAMOL 500 MG PO TABS: 500.0000 mg | ORAL_TABLET | Freq: Three times a day (TID) | ORAL | Status: DC | PRN

## 2020-02-03 MED ORDER — CLONIDINE HCL 0.1 MG PO TABS: 0.1000 mg | ORAL_TABLET | ORAL | Status: DC

## 2020-02-03 MED ORDER — LOPERAMIDE HCL 2 MG PO CAPS: 2.0000 mg | ORAL_CAPSULE | ORAL | Status: DC | PRN

## 2020-02-03 MED ORDER — ONDANSETRON 4 MG PO TBDP: 4.0000 mg | ORAL_TABLET | Freq: Four times a day (QID) | ORAL | Status: DC | PRN

## 2020-02-03 MED ORDER — CLONIDINE HCL 0.1 MG PO TABS
0.1000 mg | ORAL_TABLET | Freq: Four times a day (QID) | ORAL | Status: DC
  Administered 2020-02-04: 0.1 mg via ORAL
  Filled 2020-02-03: qty 1

## 2020-02-03 MED ORDER — ALUM & MAG HYDROXIDE-SIMETH 200-200-20 MG/5ML PO SUSP: 30.0000 mL | Freq: Four times a day (QID) | ORAL | Status: DC | PRN

## 2020-02-03 MED ORDER — HYDROXYZINE HCL 25 MG PO TABS: 25.0000 mg | ORAL_TABLET | Freq: Four times a day (QID) | ORAL | Status: DC | PRN

## 2020-02-03 MED ORDER — CLONIDINE HCL 0.1 MG PO TABS: 0.1000 mg | ORAL_TABLET | Freq: Every day | ORAL | Status: DC

## 2020-02-03 MED ORDER — NICOTINE 21 MG/24HR TD PT24: 21.0000 mg | MEDICATED_PATCH | Freq: Every day | TRANSDERMAL | Status: DC

## 2020-02-03 MED ORDER — NAPROXEN 500 MG PO TABS: 500.0000 mg | ORAL_TABLET | Freq: Two times a day (BID) | ORAL | Status: DC | PRN

## 2020-02-03 NOTE — ED Notes (Signed)
Attempted blood draw x 2, pt would not hold still or labs.

## 2020-02-03 NOTE — ED Notes (Signed)
Patient made aware urine sample is needed. 

## 2020-02-03 NOTE — ED Provider Notes (Signed)
[facility] DEPT Provider Note   CSN: [id number] Arrival date & time: 02/03/20  [id number]     History Chief Complaint  Patient presents with  . Hungry  . Suicidal without plan    Calvin Sanchez is a 29 y.o. male who presents for SI. Patient is homeless.  He has a history of polysubstance and IV drug use and states that he uses "what ever he can put in his vein."  He states that he is feeling suicidal.  He wants to "hang myself or just do anything I can to end it."  Last IV drug use was yesterday afternoon.  He has no other complaints he denies homicidal ideations or audiovisual hallucinations.  HPI     Past Medical History:  Diagnosis Date  . Homeless   . Substance abuse [facility])     Patient Active Problem List   Diagnosis Date Noted  . Cellulitis of arm 12/29/2019  . Polysubstance abuse ([place]) 12/29/2019  . IVDU (intravenous drug user) 12/29/2019  . Sepsis ([place]) 12/29/2019  . Leukocytosis 12/29/2019  . Thrombocytosis ([place]) 12/29/2019  . Abnormal LFTs 12/29/2019  . Hyperglycemia 12/29/2019  . Left arm cellulitis 12/29/2019    Past Surgical History:  Procedure Laterality Date  . KNEE SURGERY Left        No family history on file.  Social History   Tobacco Use  . Smoking status: Current Every Day Smoker    Types: Cigars  . Smokeless tobacco: Never Used  Substance Use Topics  . Alcohol use: Yes    Comment: Once a week. Last drink: yesterday   . Drug use: Yes    Types: Cocaine, Methamphetamines    Comment: Heronin. Last used: 5/26. Cocaine and Meth last used this weekend.     Home Medications Prior to Admission medications   Medication Sig Start Date End Date Taking? Authorizing Provider  sulfamethoxazole-trimethoprim (BACTRIM DS) 800-160 MG tablet Take 1 tablet by mouth 2 (two) times daily. Patient not taking: Reported on 01/25/2020 12/31/19   Elmarie Shiley, MD    Allergies    Patient has no known allergies.  Review of  Systems   Review of Systems Ten systems reviewed and are negative for acute change, except as noted in the HPI.   Physical Exam Updated Vital Signs BP 118/81   Pulse (!) 105   Temp 98.1 F (36.7 C) (Oral)   Resp 20   SpO2 100%   Physical Exam Physical Exam  Nursing note and vitals reviewed. Constitutional: He appears well-developed and well-nourished. No distress.  HENT:  Head: Normocephalic and atraumatic.  Eyes: Conjunctivae normal are normal. No scleral icterus.  Neck: Normal range of motion. Neck supple.  Cardiovascular: Normal rate, regular rhythm and normal heart sounds.   Pulmonary/Chest: Effort normal and breath sounds normal. No respiratory distress.  Abdominal: Soft. There is no tenderness.  Musculoskeletal: He exhibits no edema.  Neurological: He is alert.  Skin: Skin is warm and dry. He is not diaphoretic.  Psychiatric:  He is tearful, depressed, flat affect.   ED Results / Procedures / Treatments   Labs (all labs ordered are listed, but only abnormal results are displayed) Labs Reviewed - No data to display  EKG None  Radiology No results found.  Procedures Procedures (including critical care time)  Medications Ordered in ED Medications - No data to display  ED Course  I have reviewed the triage vital signs and the nursing notes.  Pertinent labs & imaging  results that were available during my care of the patient were reviewed by me and considered in my medical decision making (see chart for details).  Clinical Course as of Feb 02 1614  Thu Feb 03, 2020  1156 WBC(!): 23.6 [AH]    Clinical Course User Index [AH] Margarita Mail, PA-C   MDM Rules/Calculators/A&P                      See 29 year old male who is medically clear.  He does have an elevated white blood cell count and I have sent blood cultures however he does not have any evidence of sepsis.  And this would not preclude him from psychiatric evaluation or psychiatric admission.  The  patient is otherwise medically clear. Final Clinical Impression(s) / ED Diagnoses Final diagnoses:  None    Rx / DC Orders ED Discharge Orders    None       Margarita Mail, PA-C 02/03/20 [place], MD 02/04/20 [phone]

## 2020-02-03 NOTE — ED Triage Notes (Addendum)
BIB by EMS after going to fire dept to get help, reported he has not had anything to eat the last 2 days. Pt is homeless. Due to this told EMS he is suicidal without identified plan. 118/88-94-99% RA-18 CBG 78  Pt states last use of Heroin yesterday, last food 1.5 days ago, Suicidal without plan.

## 2020-02-03 NOTE — BH Assessment (Addendum)
Tele Assessment Note   Patient Name: Calvin Sanchez MRN: [id number] Referring Physician: Margarita Mail, PA-C. Location of Patient: [facility], [phone].  Location of Provider: [place]  Calvin Sanchez is an 29 y.o. male, who presents voluntary and unaccompanied to [facility]. Clinician asked the pt, "what brought you to the hospital?" Pt reported, tired of using drugs and frustrated with his life. Pt reported, "not doing what I'm suppose to do." Pt reported, having suicidal thoughts with a plan of hanging himself, for a couple of weeks. Pt reported, access to cords. Pt reported, "if I can get a gun." Pt reported, in 2014, he tried to hang himself, as a suicide attempt. Pt reported, when something happens he create the events in his head. Pt denies, HI, self-injurious behaviors and access to guns.   Pt reported, he went on a methamphetamine binder, two days ago. Pt reported, using "not that much," heroin, yesterday. Pt reported, using drugs intravenously. Pt's UDS is positive for amphetamines, cocaine. Pt denies, being linked to OPT resources (medication management and/or counseling.)  Per chart, on 01/25/2020 pt was at [facility], accidental drug overdose; pt was provided resources from social work. Pt denies, previous inpatient admissions.  Pt presents drowsy with logical, coherent, speech. Pt's eye contact was fair. Pt's mood. affect was depressed, helpless. Pt's thought process was coherent, relevant. Pt's judgement was impaired. Pt's oriented x4. Pt's concentration, insight was fair. Pt's impulse control was poor. Pt reported, if discharged from [facility] he would try to kill himself.   *Pt denies family, friends supports to be contacted to gather collateral information.*   Diagnosis: Major Depressive Disorder, single, severe with psychotic features.                      Amphetamine-type substance use disorder, severe.                     Opioid use  disorder, severe.  Past Medical History:  Past Medical History:  Diagnosis Date  . Homeless   . Substance abuse [facility])     Past Surgical History:  Procedure Laterality Date  . KNEE SURGERY Left     Family History: No family history on file.  Social History:  reports that he has been smoking cigars. He has never used smokeless tobacco. He reports current alcohol use. He reports current drug use. Drugs: Cocaine and Methamphetamines.  Additional Social History:  Alcohol / Drug Use Pain Medications: See MAR Prescriptions: See MAR Over the Counter: See MAR History of alcohol / drug use?: Yes Withdrawal Symptoms: Sweats, Other (Comment)(Back pain.) Substance #1 Name of Substance 1: Methamphetamines. 1 - Age of First Use: UTA 1 - Amount (size/oz): Pt reported, he went on "a little binder, two days ago." Pt's UDS is positve for amphetamines. 1 - Frequency: Daily. 1 - Duration: Ongoing. 1 - Last Use / Amount: Daily. Substance #2 Name of Substance 2: Heroin. 2 - Age of First Use: UTA 2 - Amount (size/oz): Pt reported, "not that much." 2 - Frequency: UTA 2 - Duration: UTA 2 - Last Use / Amount: Yesterday. Substance #3 Name of Substance 3: Cocaine. 3 - Age of First Use: UTA 3 - Amount (size/oz): Pt's UDS is positive for cocaine. 3 - Frequency: UTA 3 - Duration: UTA 3 - Last Use / Amount: UTA  CIWA: CIWA-Ar BP: 118/81 Pulse Rate: (!) 105 COWS:    Allergies: No Known Allergies  Home Medications: (Not in a hospital admission)   OB/GYN Status:  No LMP for male patient.  General Assessment Data [facility] T[facility] Assessment: In system Is this a Tele or Face-to-Face Assessment?: Tele Assessment Is this an Initial Assessment or a Re-assessment for this encounter?: Initial Assessment Patient Accompanied by:: N/A Language Other than English: No Living Arrangements: Homeless/Shelter What gender do you identify as?: Male Marital status: Single Living  Arrangements: Other (Comment)(Homeless. ) Can pt return to current living arrangement?: Yes Admission Status: Voluntary Is patient capable of signing voluntary admission?: Yes Referral Source: Self/Family/Friend Insurance type: Self-pay.      Crisis Care Plan Living Arrangements: Other (Comment)(Homeless. ) Legal Guardian: Other:(Self. ) Name of Psychiatrist: NA Name of Therapist: NA  Education Status Is patient currently in school?: No Is the patient employed, unemployed or receiving disability?: Unemployed  Risk to self with the past 6 months Suicidal Ideation: Yes-Currently Present Has patient been a risk to self within the past 6 months prior to admission? : Yes Suicidal Intent: Yes-Currently Present Has patient had any suicidal intent within the past 6 months prior to admission? : Yes Is patient at risk for suicide?: Yes Suicidal Plan?: Yes-Currently Present Has patient had any suicidal plan within the past 6 months prior to admission? : Yes Specify Current Suicidal Plan: Pt reported, wanting to hang himself. Pt expressed wanting a gun.  Access to Means: Yes Specify Access to Suicidal Means: Cords.  What has been your use of drugs/alcohol within the last 12 months?: Amphetamines, cocaine.  Previous Attempts/Gestures: Yes How many times?: 1 Other Self Harm Risks: SI, depression, homelessness.  Triggers for Past Attempts: Unknown Intentional Self Injurious Behavior: None(Pt denies.) Recent stressful life event(s): Other (Comment)(Homelessness, frustrated with life. ) Persecutory voices/beliefs?: No Depression: Yes Depression Symptoms: Feeling angry/irritable, Feeling worthless/self pity, Loss of interest in usual pleasures, Guilt, Fatigue, Isolating, Tearfulness, Insomnia, Despondent Substance abuse history and/or treatment for substance abuse?: Yes Suicide prevention information given to non-admitted patients: Not applicable  Risk to Others within the past 6  months Homicidal Ideation: No Does patient have any lifetime risk of violence toward others beyond the six months prior to admission? : No(Pt denies.) Thoughts of Harm to Others: No Current Homicidal Intent: No Current Homicidal Plan: No Access to Homicidal Means: No Identified Victim: NA History of harm to others?: No Assessment of Violence: None Noted Violent Behavior Description: NA Does patient have access to weapons?: No Criminal Charges Pending?: No Does patient have a court date: No Is patient on probation?: No  Psychosis Hallucinations: None noted Delusions: Unspecified  Mental Status Report Appearance/Hygiene: Unremarkable Eye Contact: Fair Motor Activity: Unremarkable Speech: Logical/coherent Level of Consciousness: Drowsy Mood: Depressed, Helpless Affect: Depressed, Other (Comment)(Helpless. ) Anxiety Level: Minimal Thought Processes: Coherent, Relevant Judgement: Impaired Orientation: Person, Place, Time, Situation Obsessive Compulsive Thoughts/Behaviors: None  Cognitive Functioning Concentration: Fair Memory: Recent Intact Is patient IDD: No Insight: Fair Impulse Control: Poor Appetite: Good Sleep: Decreased Total Hours of Sleep: 1 Vegetative Symptoms: None  ADLScreening [facility] Assessment Services) Patient's cognitive ability adequate to safely complete daily activities?: Yes Patient able to express need for assistance with ADLs?: Yes Independently performs ADLs?: Yes (appropriate for developmental age)  Prior Inpatient Therapy Prior Inpatient Therapy: No  Prior Outpatient Therapy Prior Outpatient Therapy: No Does patient have an ACCT team?: No Does patient have Intensive In-House Services?  : No Does patient have [facility] services? : No Does patient have P4CC services?: No  ADL Screening (condition  at time of admission) Patient's cognitive ability adequate to safely complete daily activities?: Yes Is the patient deaf or have difficulty  hearing?: No Does the patient have difficulty seeing, even when wearing glasses/contacts?: No Does the patient have difficulty concentrating, remembering, or making decisions?: No Patient able to express need for assistance with ADLs?: Yes Does the patient have difficulty dressing or bathing?: No Independently performs ADLs?: Yes (appropriate for developmental age) Does the patient have difficulty walking or climbing stairs?: No Weakness of Legs: None(Chest and back pain.) Weakness of Arms/Hands: None  Home Assistive Devices/Equipment Home Assistive Devices/Equipment: Eyeglasses    Abuse/Neglect Assessment (Assessment to be complete while patient is alone) Abuse/Neglect Assessment Can Be Completed: Yes Physical Abuse: Denies Verbal Abuse: Denies Sexual Abuse: Denies Exploitation of patient/patient's resources: Denies Self-Neglect: Denies     Regulatory affairs officer (For Healthcare) Does Patient Have a Medical Advance Directive?: No          Disposition: Adaku Anike, NP recommends overnight observation. Disposition discussed with Maylon Cos, RN.    Disposition Initial Assessment Completed for this Encounter: Yes  This service was provided via telemedicine using a 2-way, interactive audio and video technology.  Names of all persons participating in this telemedicine service and their role in this encounter. Name: Calvin Sanchez. Role: Patient.   Name: Vertell Novak, MS, [facility], [place]. Role: Counselor.           Vertell Novak 02/03/2020 11:43 PM     Vertell Novak, [place], [facility], [facility] Triage Specialist [phone]

## 2020-02-04 LAB — RAPID URINE DRUG SCREEN, HOSP PERFORMED
Amphetamines: POSITIVE — AB
Barbiturates: NOT DETECTED
Benzodiazepines: NOT DETECTED
Cocaine: POSITIVE — AB
Opiates: NOT DETECTED
Tetrahydrocannabinol: NOT DETECTED

## 2020-02-04 NOTE — ED Notes (Signed)
Pt requesting to leave. Per TTS pt is being put up for discharge and can go, they are putting in resources and his discharge instructions.

## 2020-02-04 NOTE — ED Notes (Signed)
Pt refusing blood cultures at this time

## 2020-02-04 NOTE — ED Notes (Signed)
Pt given belongings back

## 2020-02-04 NOTE — Discharge Instructions (Signed)
To help you maintain a sober lifestyle, a substance abuse treatment program may be beneficial to you.  Contact one of the following facilities at your earliest opportunity to ask about enrolling:  RESIDENTIAL PROGRAMS:       [facility]      [place], [place] [address]      [phone]       [facility]      [address] [place], [place] [address]      [phone]       [facility]      [place], [place] [address]      [phone]  OUTPATIENT PROGRAMS:       [facility], [place] [address]      [phone]      They provide psychiatry, therapy and substance abuse counseling.  New patients are seen at their walk-in clinic.  Walk-in hours are Monday - Friday from 8:30 am - 12:00 pm, and from 1:00 pm - 2:30 pm.  Walk-in patients are seen on a first come, first served basis, so try to arrive as early as possible for the best chance of being seen the same day.

## 2020-02-04 NOTE — ED Notes (Signed)
Pt stated he would prefer vitals being updated when he is given breakfast

## 2020-02-04 NOTE — ED Notes (Signed)
Pt removed pad from triage chair and is laying on the floor on pad

## 2020-02-04 NOTE — ED Notes (Signed)
Patient refused to wait for d/c paperwork and walked out refusing vitals. Patient in no active distress.

## 2020-02-04 NOTE — Consult Note (Signed)
  Patient seen and case reviewed with Dr. Dwyane Dee. Patient is alert and oriented, calm and cooperative. Patient is well known to our services here. He denies any active suicidal ideations at this time. He is offered a bed in our observation unit, to assist with stabilization and outpatient resources to include residential housing he declines. He has no motivation to stay sober. Effects of substances seems to have worn off. His mood has dramatically improved. Patient is no longer expressing any suicidal thoughts. He is engaging more. Patient has limited insight on the role of substances in his mental health. He does not seem motivated to engage in any relapse preventive measure at this time. Will discharge with outpatient resources.

## 2020-02-04 NOTE — BH Assessment (Signed)
[place] Assessment Progress Note  Per Hampton Abbot, MD, this voluntary pt would benefit from admission to the [facility] Observation Unit, but he does not meet criteria for IVC.  Pt is now asking to be discharged.  Discharge instructions include area substance abuse and mental health resources.  Pt's nurse, Morey Hummingbird, has been notified.  Jalene Mullet, [place] Triage Specialist [phone]

## 2021-01-08 ENCOUNTER — Other Ambulatory Visit: Payer: Self-pay

## 2021-01-08 ENCOUNTER — Emergency Department (HOSPITAL_COMMUNITY): Payer: Medicaid Other

## 2021-01-08 ENCOUNTER — Encounter (HOSPITAL_COMMUNITY): Payer: Self-pay | Admitting: Pharmacy Technician

## 2021-01-08 ENCOUNTER — Emergency Department (HOSPITAL_COMMUNITY)
Admission: EM | Admit: 2021-01-08 | Discharge: 2021-01-08 | Disposition: A | Discharge status: Home / Self Care | Payer: Medicaid Other | Attending: Emergency Medicine | Admitting: Emergency Medicine

## 2021-01-08 DIAGNOSIS — S91202A Unspecified open wound of left great toe with damage to nail, initial encounter: Secondary | ICD-10-CM | POA: Insufficient documentation

## 2021-01-08 DIAGNOSIS — F17290 Nicotine dependence, other tobacco product, uncomplicated: Secondary | ICD-10-CM | POA: Insufficient documentation

## 2021-01-08 DIAGNOSIS — X58XXXA Exposure to other specified factors, initial encounter: Secondary | ICD-10-CM | POA: Insufficient documentation

## 2021-01-08 DIAGNOSIS — S99929A Unspecified injury of unspecified foot, initial encounter: Secondary | ICD-10-CM

## 2021-01-08 MED ORDER — CEPHALEXIN 500 MG PO CAPS
500.0000 mg | ORAL_CAPSULE | Freq: Three times a day (TID) | ORAL | 0 refills | Status: AC
Start: 2021-01-08 — End: 2021-01-15

## 2021-01-08 NOTE — Discharge Instructions (Signed)
Call your primary care doctor or specialist as discussed in the next 2-3 days.   Return immediately back to the ER if:  Your symptoms worsen within the next 12-24 hours. You develop new symptoms such as new fevers, persistent vomiting, new pain, shortness of breath, or new weakness or numbness, or if you have any other concerns.  

## 2021-01-08 NOTE — ED Provider Notes (Signed)
Emergency Medicine Provider Triage Evaluation Note  Calvin Sanchez 30 y.o. M  was evaluated in triage.  Pt complains of bilateral feet pain, L> R.  Patient reports he is homeless and walks around a lot.  He states that he started noticing a blister to the plantar surface of his left foot and he popped it.  He also reports pain and swelling noted to his first toe.  No fevers.  No fall, trauma, injury.  Review of Systems  Positive: Foot pain Negative: Fevers  Physical Exam  BP 134/82   Pulse 70   Temp 98.2 F (36.8 C) (Oral)   Resp 18   Ht 5\' 4"  (1.626 m)   Wt 65.8 kg   SpO2 100%   BMI 24.89 kg/m  Gen:   Awake, no distress  HEENT:  Atraumatic  Resp:  Normal effort Card:   2+ DP pulses bilaterally.  MSK:   Moves extremities without difficulty.  Area of skin breakdown on the plantar surface of the right first toe.  On the dorsal surface, he does have some mild erythema, swelling as well as missing part of the toenail.  Distal cap refill intact. Neuro:  Speech clear   Medical Decision Making  Medically screening exam initiated at 3:55 AM.  Appropriate orders placed.  Calvin Sanchez was informed that the remainder of the evaluation will be completed by another provider, this initial triage assessment does not replace that evaluation, and the importance of remaining in the ED until their evaluation is complete.    Clinical Impression  Foot pain   Portions of this note were generated with Dragon dictation software. Dictation errors may occur despite best attempts at proofreading.     Volanda Napoleon, PA-C 01/08/21 1240    Lacretia Leigh, MD 01/09/21 [phone]

## 2021-01-08 NOTE — ED Triage Notes (Signed)
Pt here with wounds to top and bottom of bilateral feet. Pt denies injury, states he walks a lot and his shoes are too small.

## 2021-01-08 NOTE — ED Provider Notes (Signed)
[facility] EMERGENCY DEPARTMENT Provider Note   CSN: [id number] Arrival date & time: 01/08/21  1124     History Chief Complaint  Patient presents with  . Foot Pain    Calvin Sanchez is a 30 y.o. male.  Pt complains of bilateral feet pain, L> R.  Patient reports he is homeless and walks around a lot.  He states that he started noticing a blister to the plantar surface of his left foot and he popped it.  He also reports pain and swelling noted to his first toe.  No fevers.  No fall, trauma, injury.         Past Medical History:  Diagnosis Date  . Homeless   . Substance abuse [facility])     Patient Active Problem List   Diagnosis Date Noted  . Cellulitis of arm 12/29/2019  . Polysubstance abuse ([place]) 12/29/2019  . IVDU (intravenous drug user) 12/29/2019  . Sepsis ([place]) 12/29/2019  . Leukocytosis 12/29/2019  . Thrombocytosis 12/29/2019  . Abnormal LFTs 12/29/2019  . Hyperglycemia 12/29/2019  . Left arm cellulitis 12/29/2019    Past Surgical History:  Procedure Laterality Date  . KNEE SURGERY Left        No family history on file.  Social History   Tobacco Use  . Smoking status: Current Every Day Smoker    Types: Cigars  . Smokeless tobacco: Never Used  Vaping Use  . Vaping Use: Never used  Substance Use Topics  . Alcohol use: Yes    Comment: Once a week. Last drink: yesterday   . Drug use: Yes    Types: Cocaine, Methamphetamines    Comment: Heronin. Last used: 5/26. Cocaine and Meth last used this weekend.     Home Medications Prior to Admission medications   Medication Sig Start Date End Date Taking? Authorizing Provider  cephALEXin (KEFLEX) 500 MG capsule Take 1 capsule (500 mg total) by mouth 3 (three) times daily for 7 days. 01/08/21 01/15/21 Yes Luna Fuse, MD  sulfamethoxazole-trimethoprim (BACTRIM DS) 800-160 MG tablet Take 1 tablet by mouth 2 (two) times daily. Patient not taking: Reported on 02/03/2020 12/31/19   Elmarie Shiley, MD    Allergies    Patient has no known allergies.  Review of Systems   Review of Systems  Constitutional: Negative for fever.  HENT: Negative for ear pain and sore throat.   Eyes: Negative for pain.  Respiratory: Negative for cough.   Cardiovascular: Negative for chest pain.  Gastrointestinal: Negative for abdominal pain.  Genitourinary: Negative for flank pain.  Musculoskeletal: Negative for back pain.  Skin: Negative for color change and rash.  Neurological: Negative for syncope.  All other systems reviewed and are negative.   Physical Exam Updated Vital Signs BP 132/84 (BP Location: Right Arm)   Pulse (!) 110   Temp 98.3 F (36.8 C) (Oral)   Resp 14   SpO2 98%   Physical Exam Constitutional:      General: He is not in acute distress.    Appearance: He is well-developed.  HENT:     Head: Normocephalic.     Nose: Nose normal.  Eyes:     Extraocular Movements: Extraocular movements intact.  Cardiovascular:     Rate and Rhythm: Normal rate.  Pulmonary:     Effort: Pulmonary effort is normal.  Musculoskeletal:     Comments: Left foot greater toe tibial aspect has area of swelling and tenderness on the edge of the nail edge.  Concern for developing infectious process.  No cellulitis of the toe otherwise the dorsum of the foot and plantar surface appeared normal as well with no tenderness.  Skin:    Coloration: Skin is not jaundiced.  Neurological:     Mental Status: He is alert. Mental status is at baseline.     ED Results / Procedures / Treatments   Labs (all labs ordered are listed, but only abnormal results are displayed) Labs Reviewed - No data to display  EKG None  Radiology DG Foot Complete Left  Result Date: 01/08/2021 CLINICAL DATA:  Dorsal foot wounds, no known injury, initial encounter EXAM: LEFT FOOT - COMPLETE 3+ VIEW COMPARISON:  None. FINDINGS: There is no evidence of fracture or dislocation. There is no evidence of arthropathy or  other focal bone abnormality. Soft tissues are unremarkable. IMPRESSION: No acute abnormality noted. Electronically Signed   By: Inez Catalina M.D.   On: 01/08/2021 13:34    Procedures Procedures   Medications Ordered in ED Medications - No data to display  ED Course  I have reviewed the triage vital signs and the nursing notes.  Pertinent labs & imaging results that were available during my care of the patient were reviewed by me and considered in my medical decision making (see chart for details).    MDM Rules/Calculators/A&P                          Discussed with the patient removal of the toenail to facilitate healing.  Patient declined the procedure.  Prefer antibiotics and outpatient follow-up.  Given referral phone number for the patient to call for podiatry, given prescription antibiotics.  Advised immediate return for fevers worsening symptoms increased redness or any additional concerns.  Final Clinical Impression(s) / ED Diagnoses Final diagnoses:  Injury of nail bed of toe    Rx / DC Orders ED Discharge Orders         Ordered    cephALEXin (KEFLEX) 500 MG capsule  3 times daily        01/08/21 1354           Luna Fuse, MD 01/08/21 1354

## 2021-02-20 ENCOUNTER — Encounter (HOSPITAL_COMMUNITY): Payer: Self-pay

## 2021-02-20 ENCOUNTER — Other Ambulatory Visit: Payer: Self-pay

## 2021-02-20 ENCOUNTER — Emergency Department (HOSPITAL_COMMUNITY)
Admission: EM | Admit: 2021-02-20 | Discharge: 2021-02-20 | Disposition: A | Discharge status: Home / Self Care | Payer: Medicaid Other | Attending: Student | Admitting: Student

## 2021-02-20 DIAGNOSIS — R5383 Other fatigue: Secondary | ICD-10-CM | POA: Insufficient documentation

## 2021-02-20 DIAGNOSIS — M791 Myalgia, unspecified site: Secondary | ICD-10-CM | POA: Insufficient documentation

## 2021-02-20 DIAGNOSIS — Z5321 Procedure and treatment not carried out due to patient leaving prior to being seen by health care provider: Secondary | ICD-10-CM | POA: Insufficient documentation

## 2021-02-20 DIAGNOSIS — Z20822 Contact with and (suspected) exposure to covid-19: Secondary | ICD-10-CM | POA: Insufficient documentation

## 2021-02-20 LAB — CBC WITH DIFFERENTIAL/PLATELET
Abs Immature Granulocytes: 0.02 10*3/uL (ref 0.00–0.07)
Basophils Absolute: 0.1 10*3/uL (ref 0.0–0.1)
Basophils Relative: 1 %
Eosinophils Absolute: 0 10*3/uL (ref 0.0–0.5)
Eosinophils Relative: 0 %
HCT: 40.4 % (ref 39.0–52.0)
Hemoglobin: 13.1 g/dL (ref 13.0–17.0)
Immature Granulocytes: 0 %
Lymphocytes Relative: 21 %
Lymphs Abs: 1.6 10*3/uL (ref 0.7–4.0)
MCH: 29.8 pg (ref 26.0–34.0)
MCHC: 32.4 g/dL (ref 30.0–36.0)
MCV: 92 fL (ref 80.0–100.0)
Monocytes Absolute: 0.5 10*3/uL (ref 0.1–1.0)
Monocytes Relative: 6 %
Neutro Abs: 5.4 10*3/uL (ref 1.7–7.7)
Neutrophils Relative %: 72 %
Platelets: 400 10*3/uL (ref 150–400)
RBC: 4.39 MIL/uL (ref 4.22–5.81)
RDW: 12 % (ref 11.5–15.5)
WBC: 7.6 10*3/uL (ref 4.0–10.5)
nRBC: 0 % (ref 0.0–0.2)

## 2021-02-20 LAB — RESP PANEL BY RT-PCR (FLU A&B, COVID) ARPGX2
Influenza A by PCR: NEGATIVE
Influenza B by PCR: NEGATIVE
SARS Coronavirus 2 by RT PCR: NEGATIVE

## 2021-02-20 LAB — BASIC METABOLIC PANEL
Anion gap: 8 (ref 5–15)
BUN: 5 mg/dL — ABNORMAL LOW (ref 6–20)
CO2: 28 mmol/L (ref 22–32)
Calcium: 9.2 mg/dL (ref 8.9–10.3)
Chloride: 101 mmol/L (ref 98–111)
Creatinine, Ser: 0.74 mg/dL (ref 0.61–1.24)
GFR, Estimated: >60 mL/min (ref >60)
Glucose, Bld: 108 mg/dL — ABNORMAL HIGH (ref 70–99)
Potassium: 3.4 mmol/L — ABNORMAL LOW (ref 3.5–5.1)
Sodium: 137 mmol/L (ref 135–145)

## 2021-02-20 NOTE — ED Triage Notes (Signed)
Pt reports ongoing body fatigue, sores when he nicks an area on his body, pt reports he newly homeless and lost his job. pt unable to eat d/t no income and feeling exhausted from the heat.

## 2021-02-20 NOTE — ED Provider Notes (Signed)
Emergency Medicine Provider Triage Evaluation Note  Calvin Sanchez , a 30 y.o. male  was evaluated in triage.  Pt complains of generalized body aches and fatigue.  Patient reports symptoms have been ongoing for about a week.  Reports that he recently became homeless and has not been able to get enough to eat and drink and has been out in the heat a lot.  He reports he has had some nasal congestion and cough occasionally, has not had any measured fevers.  Unsure of any sick contacts.  No chest pain, no abdominal pain or vomiting.  Review of Systems  Positive: Fatigue, body aches, congestion, cough Negative: Fever, abdominal pain, chest pain  Physical Exam  BP 112/78   Pulse 79   Temp 98 F (36.7 C) (Oral)   Resp 16   SpO2 100%  Gen:   Awake, no distress   Resp:  Normal effort  MSK:   Moves extremities without difficulty  Other:    Medical Decision Making  Medically screening exam initiated at 11:57 AM.  Appropriate orders placed.  MORDECAI TINDOL was informed that the remainder of the evaluation will be completed by another provider, this initial triage assessment does not replace that evaluation, and the importance of remaining in the ED until their evaluation is complete.     Jacqlyn Larsen, PA-C 02/20/21 1201    Carmin Muskrat, MD 02/21/21 [phone]

## 2021-02-20 NOTE — ED Notes (Signed)
Called pt 3x no response °

## 2021-03-03 ENCOUNTER — Emergency Department (HOSPITAL_COMMUNITY)
Admission: EM | Admit: 2021-03-03 | Discharge: 2021-03-04 | Disposition: A | Discharge status: Home / Self Care | Payer: Medicaid Other | Attending: Emergency Medicine | Admitting: Emergency Medicine

## 2021-03-03 ENCOUNTER — Encounter (HOSPITAL_COMMUNITY): Payer: Self-pay

## 2021-03-03 DIAGNOSIS — F1190 Opioid use, unspecified, uncomplicated: Secondary | ICD-10-CM | POA: Insufficient documentation

## 2021-03-03 DIAGNOSIS — Z5321 Procedure and treatment not carried out due to patient leaving prior to being seen by health care provider: Secondary | ICD-10-CM | POA: Insufficient documentation

## 2021-03-03 NOTE — ED Triage Notes (Signed)
Pt reports that he has been using opiates, heroin/fentanyl, reports last use an hour ago and has been using all day. Denies SI/HI

## 2021-03-04 LAB — COMPREHENSIVE METABOLIC PANEL
ALT: 97 U/L — ABNORMAL HIGH (ref 0–44)
AST: 65 U/L — ABNORMAL HIGH (ref 15–41)
Albumin: 4.4 g/dL (ref 3.5–5.0)
Alkaline Phosphatase: 85 U/L (ref 38–126)
Anion gap: 11 (ref 5–15)
BUN: 15 mg/dL (ref 6–20)
CO2: 26 mmol/L (ref 22–32)
Calcium: 10.4 mg/dL — ABNORMAL HIGH (ref 8.9–10.3)
Chloride: 100 mmol/L (ref 98–111)
Creatinine, Ser: 0.93 mg/dL (ref 0.61–1.24)
GFR, Estimated: >60 mL/min (ref >60)
Glucose, Bld: 104 mg/dL — ABNORMAL HIGH (ref 70–99)
Potassium: 4 mmol/L (ref 3.5–5.1)
Sodium: 137 mmol/L (ref 135–145)
Total Bilirubin: 1 mg/dL (ref 0.3–1.2)
Total Protein: 9.4 g/dL — ABNORMAL HIGH (ref 6.5–8.1)

## 2021-03-04 LAB — CBC
HCT: 46.1 % (ref 39.0–52.0)
Hemoglobin: 15.3 g/dL (ref 13.0–17.0)
MCH: 29.7 pg (ref 26.0–34.0)
MCHC: 33.2 g/dL (ref 30.0–36.0)
MCV: 89.3 fL (ref 80.0–100.0)
Platelets: 417 10*3/uL — ABNORMAL HIGH (ref 150–400)
RBC: 5.16 MIL/uL (ref 4.22–5.81)
RDW: 11.9 % (ref 11.5–15.5)
WBC: 12.6 10*3/uL — ABNORMAL HIGH (ref 4.0–10.5)
nRBC: 0 % (ref 0.0–0.2)

## 2021-03-04 LAB — RAPID URINE DRUG SCREEN, HOSP PERFORMED
Amphetamines: POSITIVE — AB
Barbiturates: NOT DETECTED
Benzodiazepines: NOT DETECTED
Cocaine: NOT DETECTED
Opiates: POSITIVE — AB
Tetrahydrocannabinol: NOT DETECTED

## 2021-03-04 LAB — ETHANOL: Alcohol, Ethyl (B): <10 mg/dL (ref <10)

## 2021-03-04 NOTE — ED Notes (Signed)
Pt states that he is not going to stay

## 2021-03-26 ENCOUNTER — Other Ambulatory Visit: Payer: Self-pay

## 2021-03-26 ENCOUNTER — Ambulatory Visit (HOSPITAL_COMMUNITY)
Admission: EM | Admit: 2021-03-26 | Discharge: 2021-03-26 | Disposition: A | Discharge status: Home / Self Care | Payer: No Payment, Other | Attending: Registered Nurse | Admitting: Registered Nurse

## 2021-03-26 ENCOUNTER — Encounter (HOSPITAL_COMMUNITY): Payer: Self-pay | Admitting: Registered Nurse

## 2021-03-26 DIAGNOSIS — R45851 Suicidal ideations: Secondary | ICD-10-CM

## 2021-03-26 DIAGNOSIS — F1910 Other psychoactive substance abuse, uncomplicated: Secondary | ICD-10-CM | POA: Diagnosis present

## 2021-03-26 DIAGNOSIS — Z5900 Homelessness unspecified: Secondary | ICD-10-CM | POA: Diagnosis not present

## 2021-03-26 DIAGNOSIS — F1994 Other psychoactive substance use, unspecified with psychoactive substance-induced mood disorder: Secondary | ICD-10-CM | POA: Diagnosis present

## 2021-03-26 DIAGNOSIS — F1914 Other psychoactive substance abuse with psychoactive substance-induced mood disorder: Secondary | ICD-10-CM | POA: Insufficient documentation
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
## 2021-03-26 NOTE — Discharge Summary (Signed)
Nell Range to be D/C'd Home per NP order. Discussed with the patient and all questions fully answered.  An After Visit Summary was printed and given to the patient. Patient escorted out and D/C home via private auto.  Clois Dupes  03/26/2021 6:14 PM

## 2021-03-26 NOTE — BH Assessment (Signed)
Pt to [facility] voluntarily looking for detox. Pt reports SI, no plan. Denies HI, AVH. Last used fentanyl today.   Pt is urgent

## 2021-03-26 NOTE — Discharge Instructions (Addendum)
Substance Abuse Resources  [facility] - Admissions are currently completed Monday through Friday at [place]; both appointments and walk-ins are accepted.  Any individual that is a [facility] resident may present for a substance abuse screening and assessment for admission.  A person may be referred by numerous sources or self-refer.   Potential clients will be screened for medical necessity and appropriateness for the program.  Clients must meet criteria for high-intensity residential treatment services.  If clinically appropriate, a client will continue with the comprehensive clinical assessment and intake process, as well as enrollment in the [place].   Address: [address] [place], [place] [address] Admin Hours: Mon-Fri 8AM to [place] Hours: 24/7 Phone: [phone] Fax: [phone]   [facility] (Detox) Facility Based Crisis:  These are 3 locations for services: Please call before arrival    Address: [address] [place], [place] [address] Phone: [phone]   Address: [address] [place], [place] [address] Phone#: [phone]   Address: [address] [place], [place] [address] Phone#: [phone]     [facility] ([facility]): (offers outpatient therapy and intensive outpatient substance abuse therapy).  [address], [place], [place] [address] Phone: [phone]   [facility]: Offers FREE recovery skills classes, support groups, 1:1 Peer Support, and Compeer Classes. [address] [place], [place] [address] Phone: [phone] (Call to complete intake).  [facility] [address] [place], [place] [address] Phone: [phone] ext: [facility] provides food, shelter and other programs and services to the homeless men of [place] through our [employer].   By offering safe shelter, three meals a day,  clean clothing, Biblical counseling, financial planning, vocational training, GED/education and employment assistance, we've helped mend the shattered lives of many homeless men since opening in 1974.   We have approximately 267 beds available, with a max of 312 beds including mats for emergency situations and currently house an average of 270 men a night.   Prospective Client Check-In Information Photo ID Required (State/ Out of State/ [facility]) - if photo ID is not available, clients are required to have a printout of a police/sheriff's criminal history report. Help out with chores around the [place]. No sex offender of any type (pending, charged, registered and/or any other sex related offenses) will be permitted to check in. Must be willing to abide by all rules, regulations, and policies established by the [employer]. The following will be provided - shelter, food, clothing, and biblical counseling. If you or someone you know is in need of assistance at our [facility] shelter in [place], [place], please call [phone] ext. [phone].   [facility]-will provide timely access to mental health services for children and adolescents (4-17) and adults presenting in a mental health crisis. The program is designed for those who need urgent Behavioral Health or Substance Use treatment and are not experiencing a medical crisis that would typically require an emergency room visit.    [place], [place] [address] Phone: [phone] [email or web address]: Phone#: [phone]   The [facility] SA Intensive Outpatient Program (SAIOP) means structured individual and group addiction activities and services that are provided at an outpatient program designed to assist adult and adolescent consumers to begin recovery and learn skills for recovery maintenance. The [place] program is offered at  least 3 hours a  day, 3 days a week.SAIOP services shall include a structured program consisting of, but not limited to, the following services: Individual counseling and support; Group counseling and support; Family counseling, training or support; Biochemical assays to identify recent drug use (e.g., urine drug screens); Strategies for relapse prevention to include community and social support systems in treatment; Life skills; Crisis contingency planning; Disease Management; and Treatment support activities that have been adapted or specifically designed for persons with physical disabilities, or persons with co-occurring disorders of mental illness and substance abuse/dependence or mental retardation/developmental disability and substance abuse/dependence. Phone: [phone]      [facility]: [phone]  [place]: [phone]

## 2021-05-14 ENCOUNTER — Emergency Department (HOSPITAL_COMMUNITY)
Admission: EM | Admit: 2021-05-14 | Discharge: 2021-05-15 | Disposition: A | Discharge status: Home / Self Care | Payer: Medicaid Other | Attending: Emergency Medicine | Admitting: Emergency Medicine

## 2021-05-14 ENCOUNTER — Encounter (HOSPITAL_COMMUNITY): Payer: Self-pay

## 2021-05-14 ENCOUNTER — Other Ambulatory Visit: Payer: Self-pay

## 2021-05-14 DIAGNOSIS — Z79899 Other long term (current) drug therapy: Secondary | ICD-10-CM | POA: Insufficient documentation

## 2021-05-14 DIAGNOSIS — F419 Anxiety disorder, unspecified: Secondary | ICD-10-CM | POA: Insufficient documentation

## 2021-05-14 DIAGNOSIS — F17290 Nicotine dependence, other tobacco product, uncomplicated: Secondary | ICD-10-CM | POA: Insufficient documentation

## 2021-05-14 DIAGNOSIS — Z5900 Homelessness unspecified: Secondary | ICD-10-CM

## 2021-05-14 DIAGNOSIS — F1510 Other stimulant abuse, uncomplicated: Secondary | ICD-10-CM

## 2021-05-14 DIAGNOSIS — R253 Fasciculation: Secondary | ICD-10-CM | POA: Insufficient documentation

## 2021-05-14 DIAGNOSIS — Z20822 Contact with and (suspected) exposure to covid-19: Secondary | ICD-10-CM | POA: Insufficient documentation

## 2021-05-14 DIAGNOSIS — R456 Violent behavior: Secondary | ICD-10-CM | POA: Insufficient documentation

## 2021-05-14 LAB — COMPREHENSIVE METABOLIC PANEL
ALT: 106 U/L — ABNORMAL HIGH (ref 0–44)
AST: 114 U/L — ABNORMAL HIGH (ref 15–41)
Albumin: 4.2 g/dL (ref 3.5–5.0)
Alkaline Phosphatase: 91 U/L (ref 38–126)
Anion gap: 7 (ref 5–15)
BUN: 21 mg/dL — ABNORMAL HIGH (ref 6–20)
CO2: 23 mmol/L (ref 22–32)
Calcium: 10 mg/dL (ref 8.9–10.3)
Chloride: 105 mmol/L (ref 98–111)
Creatinine, Ser: 0.79 mg/dL (ref 0.61–1.24)
GFR, Estimated: >60 mL/min (ref >60)
Glucose, Bld: 118 mg/dL — ABNORMAL HIGH (ref 70–99)
Potassium: 3.8 mmol/L (ref 3.5–5.1)
Sodium: 135 mmol/L (ref 135–145)
Total Bilirubin: 1 mg/dL (ref 0.3–1.2)
Total Protein: 9 g/dL — ABNORMAL HIGH (ref 6.5–8.1)

## 2021-05-14 LAB — CBC WITH DIFFERENTIAL/PLATELET
Abs Immature Granulocytes: 0.04 10*3/uL (ref 0.00–0.07)
Basophils Absolute: 0.1 10*3/uL (ref 0.0–0.1)
Basophils Relative: 1 %
Eosinophils Absolute: 0.1 10*3/uL (ref 0.0–0.5)
Eosinophils Relative: 0 %
HCT: 41 % (ref 39.0–52.0)
Hemoglobin: 14.3 g/dL (ref 13.0–17.0)
Immature Granulocytes: 0 %
Lymphocytes Relative: 29 %
Lymphs Abs: 4.1 10*3/uL — ABNORMAL HIGH (ref 0.7–4.0)
MCH: 29.1 pg (ref 26.0–34.0)
MCHC: 34.9 g/dL (ref 30.0–36.0)
MCV: 83.5 fL (ref 80.0–100.0)
Monocytes Absolute: 1.2 10*3/uL — ABNORMAL HIGH (ref 0.1–1.0)
Monocytes Relative: 9 %
Neutro Abs: 8.9 10*3/uL — ABNORMAL HIGH (ref 1.7–7.7)
Neutrophils Relative %: 61 %
Platelets: 382 10*3/uL (ref 150–400)
RBC: 4.91 MIL/uL (ref 4.22–5.81)
RDW: 11.6 % (ref 11.5–15.5)
WBC: 14.5 10*3/uL — ABNORMAL HIGH (ref 4.0–10.5)
nRBC: 0 % (ref 0.0–0.2)

## 2021-05-14 LAB — RAPID URINE DRUG SCREEN, HOSP PERFORMED
Amphetamines: POSITIVE — AB
Barbiturates: NOT DETECTED
Benzodiazepines: NOT DETECTED
Cocaine: NOT DETECTED
Opiates: NOT DETECTED
Tetrahydrocannabinol: NOT DETECTED

## 2021-05-14 LAB — ETHANOL: Alcohol, Ethyl (B): <10 mg/dL (ref <10)

## 2021-05-14 LAB — RESP PANEL BY RT-PCR (FLU A&B, COVID) ARPGX2
Influenza A by PCR: NEGATIVE
Influenza B by PCR: NEGATIVE
SARS Coronavirus 2 by RT PCR: NEGATIVE

## 2021-05-14 MED ORDER — DIAZEPAM 5 MG PO TABS
10.0000 mg | ORAL_TABLET | Freq: Once | ORAL | Status: AC
  Administered 2021-05-14: 10 mg via ORAL
  Filled 2021-05-14: qty 2

## 2021-05-14 MED ORDER — HALOPERIDOL 5 MG PO TABS
5.0000 mg | ORAL_TABLET | Freq: Once | ORAL | Status: AC
  Administered 2021-05-14: 5 mg via ORAL
  Filled 2021-05-14: qty 1

## 2021-05-14 NOTE — ED Provider Notes (Signed)
[facility] DEPT Provider Note   CSN: [id number] Arrival date & time: 05/14/21  1559     History Chief Complaint  Patient presents with   Chills   Depression   Insomnia    Calvin Sanchez is a 30 y.o. male.  30 year old male presents with increased anxiety and whole body twitching.  Patient states that he has not slept in several days.  Patient thinks that he may have been given some methamphetamines.  He denies any current use of substances.  Review of his chart shows that he does have a history of polysubstance abuse as well as intravenous drug use.  Denies any fevers.  No SI or HI.  No treatment use prior to arrival      Past Medical History:  Diagnosis Date   Homeless    Substance abuse [facility])     Patient Active Problem List   Diagnosis Date Noted   Homelessness 03/26/2021   Substance induced mood disorder ([place]) 03/26/2021   Passive suicidal ideations 03/26/2021   Cellulitis of arm 12/29/2019   Polysubstance abuse ([place]) 12/29/2019   IVDU (intravenous drug user) 12/29/2019   Sepsis ([place]) 12/29/2019   Leukocytosis 12/29/2019   Thrombocytosis 12/29/2019   Abnormal LFTs 12/29/2019   Hyperglycemia 12/29/2019   Left arm cellulitis 12/29/2019    Past Surgical History:  Procedure Laterality Date   KNEE SURGERY Left        Family History  Problem Relation Age of [place]   Hypertension Mother     Social History   Tobacco Use   Smoking status: Every Day    Types: Cigars   Smokeless tobacco: Never  Vaping Use   Vaping Use: Never used  Substance Use Topics   Alcohol use: Yes    Comment: Once a week. Last drink: yesterday    Drug use: Yes    Types: Cocaine, Methamphetamines    Comment: heroin    Home Medications Prior to Admission medications   Not on File    Allergies    Patient has no known allergies.  Review of Systems   Review of Systems  All other systems reviewed and are negative.  Physical Exam Updated  Vital Signs BP (!) 142/86 (BP Location: Left Arm)   Pulse (!) 115   Temp 98.3 F (36.8 C) (Oral)   Resp 18   Ht 1.829 m (6')   Wt 70.8 kg   SpO2 92%   BMI 21.16 kg/m   Physical Exam Vitals and nursing note reviewed.  Constitutional:      General: He is not in acute distress.    Appearance: Normal appearance. He is well-developed. He is not toxic-appearing.  HENT:     Head: Normocephalic and atraumatic.  Eyes:     General: Lids are normal.     Conjunctiva/sclera: Conjunctivae normal.     Pupils: Pupils are equal, round, and reactive to light.  Neck:     Thyroid: No thyroid mass.     Trachea: No tracheal deviation.  Cardiovascular:     Rate and Rhythm: Normal rate and regular rhythm.     Heart sounds: Normal heart sounds. No murmur heard.   No gallop.  Pulmonary:     Effort: Pulmonary effort is normal. No respiratory distress.     Breath sounds: Normal breath sounds. No stridor. No decreased breath sounds, wheezing, rhonchi or rales.  Abdominal:     General: There is no distension.     Palpations: Abdomen is soft.  Tenderness: There is no abdominal tenderness. There is no rebound.  Musculoskeletal:        General: No tenderness. Normal range of motion.     Cervical back: Normal range of motion and neck supple.  Skin:    General: Skin is warm and dry.     Findings: No abrasion or rash.  Neurological:     Mental Status: He is alert and oriented to person, place, and time. Mental status is at baseline.     GCS: GCS eye subscore is 4. GCS verbal subscore is 5. GCS motor subscore is 6.     Cranial Nerves: Cranial nerves are intact. No cranial nerve deficit.     Sensory: No sensory deficit.     Motor: Motor function is intact.  Psychiatric:        Attention and Perception: Attention normal.        Mood and Affect: Mood is anxious. Affect is labile.        Speech: Speech is rapid and pressured.        Behavior: Behavior is aggressive.    ED Results / Procedures /  Treatments   Labs (all labs ordered are listed, but only abnormal results are displayed) Labs Reviewed  CBC WITH DIFFERENTIAL/PLATELET - Abnormal; Notable for the following components:      Result Value   WBC 14.5 (*)    Neutro Abs 8.9 (*)    Lymphs Abs 4.1 (*)    Monocytes Absolute 1.2 (*)    All other components within normal limits  COMPREHENSIVE METABOLIC PANEL - Abnormal; Notable for the following components:   Glucose, Bld 118 (*)    BUN 21 (*)    Total Protein 9.0 (*)    AST 114 (*)    ALT 106 (*)    All other components within normal limits  RESP PANEL BY RT-PCR (FLU A&B, COVID) ARPGX2  ETHANOL  RAPID URINE DRUG SCREEN, HOSP PERFORMED    EKG None  Radiology No results found.  Procedures Procedures   Medications Ordered in ED Medications  diazepam (VALIUM) tablet 10 mg (has no administration in time range)    ED Course  I have reviewed the triage vital signs and the nursing notes.  Pertinent labs & imaging results that were available during my care of the patient were reviewed by me and considered in my medical decision making (see chart for details).    MDM Rules/Calculators/A&P                           Patient medicated with Valium and remains very hyperactive.  UDS positive for amphetamines.  Subsequently given 5 mg of Haldol.  He is sleeping comfortably at this time.  Will be allowed to metabolize his substances.  Will sign out to next provider Final Clinical Impression(s) / ED Diagnoses Final diagnoses:  None    Rx / DC Orders ED Discharge Orders     None        Lacretia Leigh, MD 05/14/21 2221
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
## 2021-05-14 NOTE — ED Notes (Signed)
Patient very hyperactive, can't stop moving.

## 2021-05-15 NOTE — ED Provider Notes (Signed)
Patient now awake and conversant.  He states he is homeless and has nowhere to go.     Lasonia Casino, Jenny Reichmann, MD 05/15/21 [phone]

## 2021-05-21 ENCOUNTER — Other Ambulatory Visit: Payer: Self-pay

## 2021-05-21 ENCOUNTER — Encounter (HOSPITAL_BASED_OUTPATIENT_CLINIC_OR_DEPARTMENT_OTHER): Payer: Self-pay | Admitting: Emergency Medicine

## 2021-05-21 ENCOUNTER — Emergency Department (HOSPITAL_BASED_OUTPATIENT_CLINIC_OR_DEPARTMENT_OTHER)
Admission: EM | Admit: 2021-05-21 | Discharge: 2021-05-22 | Disposition: A | Discharge status: Home / Self Care | Payer: Medicaid Other | Attending: Emergency Medicine | Admitting: Emergency Medicine

## 2021-05-21 DIAGNOSIS — F1110 Opioid abuse, uncomplicated: Secondary | ICD-10-CM | POA: Insufficient documentation

## 2021-05-21 NOTE — ED Notes (Signed)
EDP at Bedside 

## 2021-05-21 NOTE — ED Triage Notes (Signed)
Pt presents to ED POV. Pt c/o withdrawal from opiates. Last used 1h ago. Pt unable to stay still in triage. COWS - 12

## 2021-05-22 ENCOUNTER — Other Ambulatory Visit (HOSPITAL_COMMUNITY)
Admission: EM | Admit: 2021-05-22 | Discharge: 2021-05-25 | Disposition: A | Discharge status: Home / Self Care | Payer: No Payment, Other | Attending: Psychiatry | Admitting: Psychiatry

## 2021-05-22 ENCOUNTER — Ambulatory Visit (HOSPITAL_COMMUNITY)
Admission: RE | Admit: 2021-05-22 | Discharge: 2021-05-22 | Disposition: A | Discharge status: Home / Self Care | Payer: Self-pay | Attending: Psychiatry | Admitting: Psychiatry

## 2021-05-22 DIAGNOSIS — F1520 Other stimulant dependence, uncomplicated: Secondary | ICD-10-CM | POA: Diagnosis not present

## 2021-05-22 DIAGNOSIS — Z5900 Homelessness unspecified: Secondary | ICD-10-CM | POA: Insufficient documentation

## 2021-05-22 DIAGNOSIS — Z20822 Contact with and (suspected) exposure to covid-19: Secondary | ICD-10-CM | POA: Insufficient documentation

## 2021-05-22 DIAGNOSIS — F1994 Other psychoactive substance use, unspecified with psychoactive substance-induced mood disorder: Secondary | ICD-10-CM | POA: Insufficient documentation

## 2021-05-22 DIAGNOSIS — F1190 Opioid use, unspecified, uncomplicated: Secondary | ICD-10-CM | POA: Diagnosis not present

## 2021-05-22 DIAGNOSIS — F1990 Other psychoactive substance use, unspecified, uncomplicated: Secondary | ICD-10-CM | POA: Diagnosis present

## 2021-05-22 DIAGNOSIS — R45851 Suicidal ideations: Secondary | ICD-10-CM | POA: Insufficient documentation

## 2021-05-22 DIAGNOSIS — F1120 Opioid dependence, uncomplicated: Secondary | ICD-10-CM

## 2021-05-22 LAB — LIPID PANEL
Cholesterol: 151 mg/dL (ref 0–200)
HDL: 77 mg/dL (ref >40)
LDL Cholesterol: 56 mg/dL (ref 0–99)
Total CHOL/HDL Ratio: 2 RATIO
Triglycerides: 88 mg/dL (ref <150)
VLDL: 18 mg/dL (ref 0–40)

## 2021-05-22 LAB — POCT URINE DRUG SCREEN - MANUAL ENTRY (I-SCREEN)
POC Amphetamine UR: POSITIVE — AB
POC Buprenorphine (BUP): NOT DETECTED
POC Cocaine UR: NOT DETECTED
POC Marijuana UR: POSITIVE — AB
POC Methadone UR: NOT DETECTED
POC Methamphetamine UR: POSITIVE — AB
POC Morphine: POSITIVE — AB
POC Oxazepam (BZO): POSITIVE — AB
POC Oxycodone UR: NOT DETECTED
POC Secobarbital (BAR): NOT DETECTED

## 2021-05-22 LAB — COMPREHENSIVE METABOLIC PANEL
ALT: 108 U/L — ABNORMAL HIGH (ref 0–44)
AST: 94 U/L — ABNORMAL HIGH (ref 15–41)
Albumin: 3.6 g/dL (ref 3.5–5.0)
Alkaline Phosphatase: 92 U/L (ref 38–126)
Anion gap: 11 (ref 5–15)
BUN: 15 mg/dL (ref 6–20)
CO2: 26 mmol/L (ref 22–32)
Calcium: 9.2 mg/dL (ref 8.9–10.3)
Chloride: 100 mmol/L (ref 98–111)
Creatinine, Ser: 0.65 mg/dL (ref 0.61–1.24)
GFR, Estimated: >60 mL/min (ref >60)
Glucose, Bld: 77 mg/dL (ref 70–99)
Potassium: 4.3 mmol/L (ref 3.5–5.1)
Sodium: 137 mmol/L (ref 135–145)
Total Bilirubin: 0.4 mg/dL (ref 0.3–1.2)
Total Protein: 7.4 g/dL (ref 6.5–8.1)

## 2021-05-22 LAB — CBC WITH DIFFERENTIAL/PLATELET
Abs Immature Granulocytes: 0.02 10*3/uL (ref 0.00–0.07)
Basophils Absolute: 0 10*3/uL (ref 0.0–0.1)
Basophils Relative: 1 %
Eosinophils Absolute: 0.6 10*3/uL — ABNORMAL HIGH (ref 0.0–0.5)
Eosinophils Relative: 8 %
HCT: 40.7 % (ref 39.0–52.0)
Hemoglobin: 13.7 g/dL (ref 13.0–17.0)
Immature Granulocytes: 0 %
Lymphocytes Relative: 41 %
Lymphs Abs: 2.9 10*3/uL (ref 0.7–4.0)
MCH: 29 pg (ref 26.0–34.0)
MCHC: 33.7 g/dL (ref 30.0–36.0)
MCV: 86.2 fL (ref 80.0–100.0)
Monocytes Absolute: 0.9 10*3/uL (ref 0.1–1.0)
Monocytes Relative: 13 %
Neutro Abs: 2.6 10*3/uL (ref 1.7–7.7)
Neutrophils Relative %: 37 %
Platelets: 367 10*3/uL (ref 150–400)
RBC: 4.72 MIL/uL (ref 4.22–5.81)
RDW: 11.9 % (ref 11.5–15.5)
WBC: 6.9 10*3/uL (ref 4.0–10.5)
nRBC: 0 % (ref 0.0–0.2)

## 2021-05-22 LAB — URINALYSIS, COMPLETE (UACMP) WITH MICROSCOPIC
Bilirubin Urine: NEGATIVE
Glucose, UA: NEGATIVE mg/dL
Hgb urine dipstick: NEGATIVE
Ketones, ur: NEGATIVE mg/dL
Leukocytes,Ua: NEGATIVE
Nitrite: NEGATIVE
Protein, ur: NEGATIVE mg/dL
Specific Gravity, Urine: 1.031 — ABNORMAL HIGH (ref 1.005–1.030)
pH: 5 (ref 5.0–8.0)

## 2021-05-22 LAB — RESP PANEL BY RT-PCR (FLU A&B, COVID) ARPGX2
Influenza A by PCR: NEGATIVE
Influenza A by PCR: NEGATIVE
Influenza B by PCR: NEGATIVE
Influenza B by PCR: NEGATIVE
SARS Coronavirus 2 by RT PCR: NEGATIVE
SARS Coronavirus 2 by RT PCR: NEGATIVE

## 2021-05-22 LAB — HEMOGLOBIN A1C
Hgb A1c MFr Bld: 5.4 % (ref 4.8–5.6)
Mean Plasma Glucose: 108.28 mg/dL

## 2021-05-22 LAB — MAGNESIUM: Magnesium: 2.3 mg/dL (ref 1.7–2.4)

## 2021-05-22 LAB — ETHANOL: Alcohol, Ethyl (B): <10 mg/dL (ref <10)

## 2021-05-22 LAB — HIV ANTIBODY (ROUTINE TESTING W REFLEX): HIV Screen 4th Generation wRfx: NONREACTIVE

## 2021-05-22 LAB — POC SARS CORONAVIRUS 2 AG: SARSCOV2ONAVIRUS 2 AG: NEGATIVE

## 2021-05-22 LAB — TSH: TSH: 0.405 u[IU]/mL (ref 0.350–4.500)

## 2021-05-22 MED ORDER — TRAZODONE HCL 50 MG PO TABS
50.0000 mg | ORAL_TABLET | Freq: Every evening | ORAL | Status: DC | PRN
Start: 2021-05-22 — End: 2021-05-25

## 2021-05-22 MED ORDER — MAGNESIUM HYDROXIDE 400 MG/5ML PO SUSP
30.0000 mL | Freq: Every day | ORAL | Status: DC | PRN
Start: 2021-05-22 — End: 2021-05-25

## 2021-05-22 MED ORDER — ACETAMINOPHEN 325 MG PO TABS: 650.0000 mg | ORAL_TABLET | Freq: Four times a day (QID) | ORAL | Status: DC | PRN

## 2021-05-22 MED ORDER — DICYCLOMINE HCL 20 MG PO TABS: 20.0000 mg | ORAL_TABLET | Freq: Four times a day (QID) | ORAL | Status: DC | PRN

## 2021-05-22 MED ORDER — CLONIDINE HCL 0.1 MG PO TABS
0.1000 mg | ORAL_TABLET | Freq: Every day | ORAL | Status: DC
Start: 2021-05-27 — End: 2021-05-25

## 2021-05-22 MED ORDER — CLONIDINE HCL 0.1 MG PO TABS
0.1000 mg | ORAL_TABLET | Freq: Four times a day (QID) | ORAL | Status: DC
  Administered 2021-05-23 – 2021-05-24 (×9): 0.1 mg via ORAL
  Filled 2021-05-22 (×9): qty 1

## 2021-05-22 MED ORDER — LOPERAMIDE HCL 2 MG PO CAPS: 2.0000 mg | ORAL_CAPSULE | ORAL | Status: DC | PRN

## 2021-05-22 MED ORDER — METHOCARBAMOL 500 MG PO TABS: 500.0000 mg | ORAL_TABLET | Freq: Three times a day (TID) | ORAL | Status: DC | PRN

## 2021-05-22 MED ORDER — ONDANSETRON 4 MG PO TBDP
4.0000 mg | ORAL_TABLET | Freq: Four times a day (QID) | ORAL | Status: DC | PRN
  Administered 2021-05-23: 4 mg via ORAL
  Filled 2021-05-22: qty 1

## 2021-05-22 MED ORDER — NAPROXEN 500 MG PO TABS
500.0000 mg | ORAL_TABLET | Freq: Two times a day (BID) | ORAL | Status: DC | PRN
Start: 2021-05-22 — End: 2021-05-25
  Administered 2021-05-23 – 2021-05-24 (×2): 500 mg via ORAL
  Filled 2021-05-22 (×2): qty 1

## 2021-05-22 MED ORDER — HYDROXYZINE HCL 25 MG PO TABS
25.0000 mg | ORAL_TABLET | Freq: Four times a day (QID) | ORAL | Status: DC | PRN
Start: 2021-05-22 — End: 2021-05-25

## 2021-05-22 MED ORDER — CLONIDINE HCL 0.1 MG PO TABS: ORAL_TABLET | ORAL | 0 refills | Status: DC

## 2021-05-22 MED ORDER — PROMETHAZINE HCL 25 MG PO TABS: 25.0000 mg | ORAL_TABLET | Freq: Four times a day (QID) | ORAL | 0 refills | Status: DC | PRN

## 2021-05-22 MED ORDER — CLONIDINE HCL 0.1 MG PO TABS
0.1000 mg | ORAL_TABLET | ORAL | Status: DC
  Filled 2021-05-22: qty 5

## 2021-05-22 MED ORDER — ALUM & MAG HYDROXIDE-SIMETH 200-200-20 MG/5ML PO SUSP
30.0000 mL | ORAL | Status: DC | PRN
Start: 2021-05-22 — End: 2021-05-25

## 2021-05-22 NOTE — H&P (Signed)
Behavioral Health Medical Screening Exam  Calvin Sanchez is a 30 y.o. male seen face to face by this provider with TTS counselor as voluntary walk-in at [facility] accompanied by no one. Patient reports he went to [facility] ED for detox, discharged and presents to [facility] for detox. Patient endorses using heroin, meth, and percocet. Last used meth last night, last used heroin at 5pm yesterday. Initially patient had an unsteady gait, but when asked to walk down hallway, he was more steady.   Endorses suicidal ideation with plan to hang himself, when asked about this, he reports that he just needs help for his opioid use and really just needs help with detox so he can get into a treatment facility. He picks at his skin and has "boil-like" areas on his right wrist and back of hand. Right antecubital area does not not look infected where he injects heroin.   Has not slept in 2 days and attributes this to his unsteady gait. Patient is homeless x 6 months, eats fair, no sleep as reported above. Wants detox for opioid use.   Total Time spent with patient: 30 minutes  Psychiatric Specialty Exam:  Presentation  General Appearance: Appropriate for Environment; Disheveled  Eye Contact:Fair  Speech:Clear and Coherent; Normal Rate  Speech Volume:Normal  Handedness:Right   Mood and Affect  Mood:Anxious  Affect:Congruent   Thought Process  Thought Processes:Coherent; Goal Directed  Descriptions of Associations:Intact  Orientation:Full (Time, Place and Person)  Thought Content:WDL  History of Schizophrenia/Schizoaffective disorder:No data recorded Duration of Psychotic Symptoms:No data recorded Hallucinations:No data recorded Ideas of Reference:None  Suicidal Thoughts:No data recorded Homicidal Thoughts:No data recorded  Sensorium  Memory:Immediate Good; Recent Good; Remote Good  Judgment:Intact  Insight:Present (Supose to be starting a job next week)   Community education officer   Concentration: No data recorded Attention Span:Good  [place]  Language:Good   Psychomotor Activity  Psychomotor Activity: No data recorded  Assets  Assets:Communication Skills; Social Support; Resilience; Physical Health   Sleep  Sleep: No data recorded   Physical Exam: Physical Exam Vitals reviewed.  Cardiovascular:     Rate and Rhythm: Normal rate.  Pulmonary:     Effort: Pulmonary effort is normal.     Breath sounds: Normal breath sounds.  Skin:    General: Skin is warm and dry.  Neurological:     Mental Status: He is alert and oriented to person, place, and time.  Psychiatric:        Attention and Perception: Attention normal.        Mood and Affect: Mood is depressed. Affect is tearful.        Speech: Speech normal.        Behavior: Behavior is cooperative.        Thought Content: Thought content is not paranoid or delusional. Thought content includes suicidal ideation. Thought content does not include homicidal ideation. Thought content includes suicidal plan. Thought content does not include homicidal plan.   Review of Systems  Constitutional:  Negative for chills and fever.  Respiratory:  Negative for shortness of breath.   Cardiovascular:  Negative for chest pain.  Gastrointestinal:  Negative for abdominal pain, nausea and vomiting.  Neurological:  Negative for headaches.  Psychiatric/Behavioral:  Positive for depression, substance abuse and suicidal ideas. Negative for hallucinations. The patient has insomnia. The patient is not nervous/anxious.   Blood pressure 107/76, pulse 82, temperature 97.9 F (36.6 C), temperature source Oral, resp. rate 16, SpO2 98 %. There is  no height or weight on file to calculate BMI.  Musculoskeletal: Strength & Muscle Tone: within normal limits Gait & Station: normal Patient leans: N/A   Recommendations:  Based on my evaluation the patient does not appear to have an emergency medical  condition. Meets criteria for facility based crisis unit for detox, secure chat send to Dr. Serafina Mitchell and Thomes Lolling, NP for consideration for admission to [facility]. Patient agrees with plan. Covid ordered for [facility] transfer as long as rapid is negative.  Chalmers Guest, NP 05/22/2021, 2:55 PM

## 2021-05-22 NOTE — ED Notes (Signed)
Patient comfortable at this Time. Call [facility] [place]. Staff will continue to Monitor.

## 2021-05-22 NOTE — ED Provider Notes (Signed)
Behavioral Health Admission H&P [facility] & OBS)  Date: 05/22/21 Patient Name: Calvin Sanchez MRN: [id number] Chief Complaint: No chief complaint on file.     Diagnoses:  Final diagnoses:  Substance induced mood disorder ([name])  Heroin use disorder, severe ([name])  Methamphetamine use disorder, severe ([name])    HPI: Calvin Sanchez is a 30 y.o. male with a history of heroin use disorder and methamphetamine use disorder who presented to [facility] requesting detox. Patient was evaluated by Pecolia Ades, NP and recommended for transfer to [facility].   On evaluation patient is alert and oriented x 4, pleasant, and cooperative. Speech is clear and coherent. Mood is depressed and affect is congruent with mood. Thought process is coherent and thought content is logical. Denies auditory and visual hallucinations. No indication that patient is responding to internal stimuli. No evidence of delusional thought content. Reports passive SI without intent or plan. Denies homicidal ideations. Reports daily use of methamphetamine and heroin. Reports IV injection of heroin. Last use yesterday. UDS positive for amphetamine, methamphetamine, oxazepam, morphine, and marijuana.   PHQ 2-9:  Flowsheet Row OP Visit from 05/22/2021 in [place]  Thoughts that you would be better off dead, or of hurting yourself in some way More than half the days  PHQ-9 Total Score 13       [place] ED from 05/22/2021 in [facility] Most recent reading at 05/22/2021 11:00 PM OP Visit from 05/22/2021 in [place] Most recent reading at 05/22/2021  4:40 PM ED from 05/21/2021 in [facility] Most recent reading at 05/21/2021  9:54 PM  C-SSRS RISK CATEGORY High Risk High Risk No Risk        Total Time spent with patient: 30 minutes  Musculoskeletal  Strength & Muscle Tone: within normal limits Gait & Station:  normal Patient leans: N/A  Psychiatric Specialty Exam  Presentation General Appearance: Appropriate for Environment; Disheveled  Eye Contact:Fair  Speech:Clear and Coherent; Normal Rate  Speech Volume:Normal  Handedness:Right   Mood and Affect  Mood:Anxious; Depressed  Affect:Congruent   Thought Process  Thought Processes:Coherent; Goal Directed  Descriptions of Associations:Intact  Orientation:Full (Time, Place and Person)  Thought Content:WDL  Diagnosis of Schizophrenia or Schizoaffective disorder in past: No   Hallucinations:Hallucinations: None  Ideas of Reference:None  Suicidal Thoughts:Suicidal Thoughts: Yes, Passive SI Passive Intent and/or Plan: Without Intent; Without Plan  Homicidal Thoughts:Homicidal Thoughts: No   Sensorium  Memory:Immediate Good; Recent Good; Remote Good  Judgment:Intact  Insight:Present   Executive Functions  Concentration:Fair  Attention Span:Fair  Recall:Good  Fund of Knowledge:Good  Language:Good   Psychomotor Activity  Psychomotor Activity:Psychomotor Activity: Normal   Assets  Assets:Communication Skills; Desire for Improvement; Physical Health   Sleep  Sleep:Sleep: Good   Nutritional Assessment (For OBS and FBC admissions only) Has the patient had a weight loss or gain of 10 pounds or more in the last 3 months?: No Has the patient had a decrease in food intake/or appetite?: No Does the patient have dental problems?: No Does the patient have eating habits or behaviors that may be indicators of an eating disorder including binging or inducing vomiting?: No Has the patient recently lost weight without trying?: 0 Has the patient been eating poorly because of a decreased appetite?: 0 Malnutrition Screening Tool Score: 0   Physical Exam Constitutional:      General: He is not in acute distress.    Appearance: He is not ill-appearing, toxic-appearing  or diaphoretic.  HENT:     Head: Normocephalic.      Right Ear: External ear normal.     Left Ear: External ear normal.  Eyes:     Pupils: Pupils are equal, round, and reactive to light.  Cardiovascular:     Rate and Rhythm: Normal rate.  Pulmonary:     Effort: Pulmonary effort is normal. No respiratory distress.  Musculoskeletal:        General: Normal range of motion.  Skin:    General: Skin is warm and dry.  Neurological:     Mental Status: He is alert and oriented to person, place, and time.  Psychiatric:        Mood and Affect: Mood is anxious and depressed.        Speech: Speech normal.        Behavior: Behavior is cooperative.        Thought Content: Thought content is not paranoid or delusional. Thought content includes suicidal ideation. Thought content does not include homicidal ideation. Thought content does not include suicidal plan.   Review of Systems  Constitutional:  Negative for chills, diaphoresis, fever, malaise/fatigue and weight loss.  HENT:  Negative for congestion.   Respiratory:  Negative for cough and shortness of breath.   Cardiovascular:  Negative for chest pain and palpitations.  Gastrointestinal:  Negative for diarrhea, nausea and vomiting.  Neurological:  Negative for dizziness and seizures.  Psychiatric/Behavioral:  Positive for depression, substance abuse and suicidal ideas. Negative for hallucinations and memory loss. The patient is nervous/anxious and has insomnia.   All other systems reviewed and are negative.  Blood pressure 127/77, pulse 89, temperature 97.9 F (36.6 C), temperature source Oral, resp. rate 16, SpO2 95 %. There is no height or weight on file to calculate BMI.  Past Psychiatric History: Heroin use, methamphetamine use  Is the patient at risk to self? Yes -at risk to self due to heroin use Has the patient been a risk to self in the past 6 months? Yes .    Has the patient been a risk to self within the distant past? Yes   Is the patient a risk to others? No   Has the patient  been a risk to others in the past 6 months? No   Has the patient been a risk to others within the distant past? No   Past Medical History:  Past Medical History:  Diagnosis Date   Homeless    Substance abuse [facility])     Past Surgical History:  Procedure Laterality Date   KNEE SURGERY Left     Family History:  Family History  Problem Relation Age of Onset   Hypertension Mother     Social History:  Social History   Socioeconomic History   Marital status: Single    Spouse name: Not on file   Number of children: Not on file   Years of education: Not on file   Highest education level: Not on file  Occupational History   Not on file  Tobacco Use   Smoking status: Every Day    Types: Cigars   Smokeless tobacco: Never  Vaping Use   Vaping Use: Never used  Substance and Sexual Activity   Alcohol use: Yes    Comment: Once a week. Last drink: yesterday    Drug use: Yes    Types: Cocaine, Methamphetamines    Comment: heroin   Sexual activity: Not on file  Other Topics Concern  Not on file  Social History Narrative   Not on file   Social Determinants of Health   Financial Resource Strain: Not on file  Food Insecurity: Not on file  Transportation Needs: Not on file  Physical Activity: Not on file  Stress: Not on file  Social Connections: Not on file  Intimate Partner Violence: Not on file    SDOH:  SDOH Screenings   Alcohol Screen: Not on file  Depression (PHQ2-9): Medium Risk   PHQ-2 Score: 13  Financial Resource Strain: Not on file  Food Insecurity: Not on file  Housing: Not on file  Physical Activity: Not on file  Social Connections: Not on file  Stress: Not on file  Tobacco Use: High Risk   Smoking Tobacco Use: Every Day   Smokeless Tobacco Use: Never  Transportation Needs: Not on file    Last Labs:  Admission on 05/22/2021  Component Date Value Ref Range Status   WBC 05/22/2021 6.9  4.0 - 10.5 K/uL Final   RBC 05/22/2021 4.72  4.22 - 5.81 MIL/uL  Final   Hemoglobin 05/22/2021 13.7  13.0 - 17.0 g/dL Final   HCT 05/22/2021 40.7  39.0 - 52.0 % Final   MCV 05/22/2021 86.2  80.0 - 100.0 fL Final   MCH 05/22/2021 29.0  26.0 - 34.0 pg Final   MCHC 05/22/2021 33.7  30.0 - 36.0 g/dL Final   RDW 05/22/2021 11.9  11.5 - 15.5 % Final   Platelets 05/22/2021 367  150 - 400 K/uL Final   nRBC 05/22/2021 0.0  0.0 - 0.2 % Final   Neutrophils Relative % 05/22/2021 37  % Final   Neutro Abs 05/22/2021 2.6  1.7 - 7.7 K/uL Final   Lymphocytes Relative 05/22/2021 41  % Final   Lymphs Abs 05/22/2021 2.9  0.7 - 4.0 K/uL Final   Monocytes Relative 05/22/2021 13  % Final   Monocytes Absolute 05/22/2021 0.9  0.1 - 1.0 K/uL Final   Eosinophils Relative 05/22/2021 8  % Final   Eosinophils Absolute 05/22/2021 0.6 (A) 0.0 - 0.5 K/uL Final   Basophils Relative 05/22/2021 1  % Final   Basophils Absolute 05/22/2021 0.0  0.0 - 0.1 K/uL Final   Immature Granulocytes 05/22/2021 0  % Final   Abs Immature Granulocytes 05/22/2021 0.02  0.00 - 0.07 K/uL Final   Performed at [facility], [place] [address], [place], [place] [address]   Sodium 05/22/2021 137  135 - 145 mmol/L Final   Potassium 05/22/2021 4.3  3.5 - 5.1 mmol/L Final   Chloride 05/22/2021 100  98 - 111 mmol/L Final   CO2 05/22/2021 26  22 - 32 mmol/L Final   Glucose, Bld 05/22/2021 77  70 - 99 mg/dL Final   Glucose reference range applies only to samples taken after fasting for at least 8 hours.   BUN 05/22/2021 15  6 - 20 mg/dL Final   Creatinine, Ser 05/22/2021 0.65  0.61 - 1.24 mg/dL Final   Calcium 05/22/2021 9.2  8.9 - 10.3 mg/dL Final   Total Protein 05/22/2021 7.4  6.5 - 8.1 g/dL Final   POST-ULTRACENTRIFUGATION   Albumin 05/22/2021 3.6  3.5 - 5.0 g/dL Final   AST 05/22/2021 94 (A) 15 - 41 U/L Final   ALT 05/22/2021 108 (A) 0 - 44 U/L Final   Alkaline Phosphatase 05/22/2021 92  38 - 126 U/L Final   Total Bilirubin 05/22/2021 0.4  0.3 - 1.2 mg/dL Final   GFR, Estimated 05/22/2021 >60  >  60  mL/min Final   Comment: (NOTE) Calculated using the CKD-EPI Creatinine Equation (2021)    Anion gap 05/22/2021 11  5 - 15 Final   Performed at [facility], [place] [address] [place], [place] [address]   Hgb A1c MFr Bld 05/22/2021 5.4  4.8 - 5.6 % Final   Comment: (NOTE) Pre diabetes:          5.7%-6.4%  Diabetes:              >6.4%  Glycemic control for   <7.0% adults with diabetes    Mean Plasma Glucose 05/22/2021 108.28  mg/dL Final   Performed at [facility], [place] [address] [place], [place] [address]   Magnesium 05/22/2021 2.3  1.7 - 2.4 mg/dL Final   Performed at [place] [address] [place], [place] [address]   Alcohol, Ethyl (B) 05/22/2021 <10  <10 mg/dL Final   Comment: (NOTE) Lowest detectable limit for serum alcohol is 10 mg/dL.  For medical purposes only. Performed at [facility], [place] [address], [place], [place] [address]    TSH 05/22/2021 0.405  0.350 - 4.500 uIU/mL Final   Comment: Performed by a 3rd Generation assay with a functional sensitivity of <=0.01 uIU/mL. Performed at [facility], [place] [address] [place], [place] [address]    Color, Urine 05/22/2021 YELLOW  YELLOW Final   APPearance 05/22/2021 HAZY (A) CLEAR Final   Specific Gravity, Urine 05/22/2021 1.031 (A) 1.005 - 1.030 Final   pH 05/22/2021 5.0  5.0 - 8.0 Final   Glucose, UA 05/22/2021 NEGATIVE  NEGATIVE mg/dL Final   Hgb urine dipstick 05/22/2021 NEGATIVE  NEGATIVE Final   Bilirubin Urine 05/22/2021 NEGATIVE  NEGATIVE Final   Ketones, ur 05/22/2021 NEGATIVE  NEGATIVE mg/dL Final   Protein, ur 05/22/2021 NEGATIVE  NEGATIVE mg/dL Final   Nitrite 05/22/2021 NEGATIVE  NEGATIVE Final   Leukocytes,Ua 05/22/2021 NEGATIVE  NEGATIVE Final   RBC / HPF 05/22/2021 0-5  0 - 5 RBC/hpf Final   WBC, UA 05/22/2021 0-5  0 - 5 WBC/hpf Final   Bacteria, UA 05/22/2021 RARE (A) NONE SEEN Final   Squamous Epithelial / LPF 05/22/2021 0-5  0 - 5 Final   Mucus 05/22/2021  PRESENT   Final   Hyaline Casts, UA 05/22/2021 PRESENT   Final   Performed at [facility], [place] [address] [place], [place] [address]   POC Amphetamine UR 05/22/2021 Positive (A) NONE DETECTED (Cut Off Level 1000 ng/mL) Final   POC Secobarbital (BAR) 05/22/2021 None Detected  NONE DETECTED (Cut Off Level 300 ng/mL) Final   POC Buprenorphine (BUP) 05/22/2021 None Detected  NONE DETECTED (Cut Off Level 10 ng/mL) Final   POC Oxazepam (BZO) 05/22/2021 Positive (A) NONE DETECTED (Cut Off Level 300 ng/mL) Final   POC Cocaine UR 05/22/2021 None Detected  NONE DETECTED (Cut Off Level 300 ng/mL) Final   POC Methamphetamine UR 05/22/2021 Positive (A) NONE DETECTED (Cut Off Level 1000 ng/mL) Final   POC Morphine 05/22/2021 Positive (A) NONE DETECTED (Cut Off Level 300 ng/mL) Final   POC Oxycodone UR 05/22/2021 None Detected  NONE DETECTED (Cut Off Level 100 ng/mL) Final   POC Methadone UR 05/22/2021 None Detected  NONE DETECTED (Cut Off Level 300 ng/mL) Final   POC Marijuana UR 05/22/2021 Positive (A) NONE DETECTED (Cut Off Level 50 ng/mL) Final   HIV Screen 4th Generation wRfx 05/22/2021 Non Reactive  Non Reactive Final  Performed at [facility], [place] [address], [place], [place] [address]   SARS Coronavirus 2 by RT PCR 05/22/2021 NEGATIVE  NEGATIVE Final   Comment: (NOTE) SARS-CoV-2 target nucleic acids are NOT DETECTED.  The SARS-CoV-2 RNA is generally detectable in upper respiratory specimens during the acute phase of infection. The lowest concentration of SARS-CoV-2 viral copies this assay can detect is 138 copies/mL. A negative result does not preclude SARS-Cov-2 infection and should not be used as the sole basis for treatment or other patient management decisions. A negative result may occur with  improper specimen collection/handling, submission of specimen other than nasopharyngeal swab, presence of viral mutation(s) within the areas targeted by this assay, and inadequate  number of viral copies(<138 copies/mL). A negative result must be combined with clinical observations, patient history, and epidemiological information. The expected result is Negative.  Fact Sheet for Patients:  EntrepreneurPulse.com.au  Fact Sheet for Healthcare Providers:  IncredibleEmployment.be  This test is no                          t yet approved or cleared by the [place] FDA and  has been authorized for detection and/or diagnosis of SARS-CoV-2 by FDA under an Emergency Use Authorization (EUA). This EUA will remain  in effect (meaning this test can be used) for the duration of the COVID-19 declaration under Section 564(b)(1) of the Act, 21 U.S.C.section 360bbb-3(b)(1), unless the authorization is terminated  or revoked sooner.       Influenza A by PCR 05/22/2021 NEGATIVE  NEGATIVE Final   Influenza B by PCR 05/22/2021 NEGATIVE  NEGATIVE Final   Comment: (NOTE) The Xpert Xpress SARS-CoV-2/FLU/RSV plus assay is intended as an aid in the diagnosis of influenza from Nasopharyngeal swab specimens and should not be used as a sole basis for treatment. Nasal washings and aspirates are unacceptable for Xpert Xpress SARS-CoV-2/FLU/RSV testing.  Fact Sheet for Patients: EntrepreneurPulse.com.au  Fact Sheet for Healthcare Providers: IncredibleEmployment.be  This test is not yet approved or cleared by the [place] FDA and has been authorized for detection and/or diagnosis of SARS-CoV-2 by FDA under an Emergency Use Authorization (EUA). This EUA will remain in effect (meaning this test can be used) for the duration of the COVID-19 declaration under Section 564(b)(1) of the Act, 21 U.S.C. section 360bbb-3(b)(1), unless the authorization is terminated or revoked.  Performed at [facility], [place] [address], [place],  [address]    Cholesterol 05/22/2021 151  0 - 200 mg/dL Final    LIPEMIC SPECIMEN   Triglycerides 05/22/2021 88  <150 mg/dL Final   HDL 05/22/2021 77  >40 mg/dL Final   Total CHOL/HDL Ratio 05/22/2021 2.0  RATIO Final   VLDL 05/22/2021 18  0 - 40 mg/dL Final   LDL Cholesterol 05/22/2021 56  0 - 99 mg/dL Final   Comment:        Total Cholesterol/HDL:CHD Risk Coronary Heart Disease Risk Table                     Men   Women  1/2 Average Risk   3.4   3.3  Average Risk       5.0   4.4  2 X Average Risk   9.6   7.1  3 X Average Risk  23.4   11.0        Use the calculated Patient Ratio above and the CHD Risk Table to determine  the patient's CHD Risk.        ATP III CLASSIFICATION (LDL):  <100     mg/dL   Optimal  100-129  mg/dL   Near or Above                    Optimal  130-159  mg/dL   Borderline  160-189  mg/dL   High  >190     mg/dL   Very High Performed at [facility] [address] [place], [place] [address]   Hospital Outpatient Visit on 05/22/2021  Component Date Value Ref Range Status   SARS Coronavirus 2 by RT PCR 05/22/2021 NEGATIVE  NEGATIVE Final   Comment: (NOTE) SARS-CoV-2 target nucleic acids are NOT DETECTED.  The SARS-CoV-2 RNA is generally detectable in upper respiratory specimens during the acute phase of infection. The lowest concentration of SARS-CoV-2 viral copies this assay can detect is 138 copies/mL. A negative result does not preclude SARS-Cov-2 infection and should not be used as the sole basis for treatment or other patient management decisions. A negative result may occur with  improper specimen collection/handling, submission of specimen other than nasopharyngeal swab, presence of viral mutation(s) within the areas targeted by this assay, and inadequate number of viral copies(<138 copies/mL). A negative result must be combined with clinical observations, patient history, and epidemiological information. The expected result is Negative.  Fact Sheet for Patients:   EntrepreneurPulse.com.au  Fact Sheet for Healthcare Providers:  IncredibleEmployment.be  This test is no                          t yet approved or cleared by the [place] FDA and  has been authorized for detection and/or diagnosis of SARS-CoV-2 by FDA under an Emergency Use Authorization (EUA). This EUA will remain  in effect (meaning this test can be used) for the duration of the COVID-19 declaration under Section 564(b)(1) of the Act, 21 U.S.C.section 360bbb-3(b)(1), unless the authorization is terminated  or revoked sooner.       Influenza A by PCR 05/22/2021 NEGATIVE  NEGATIVE Final   Influenza B by PCR 05/22/2021 NEGATIVE  NEGATIVE Final   Comment: (NOTE) The Xpert Xpress SARS-CoV-2/FLU/RSV plus assay is intended as an aid in the diagnosis of influenza from Nasopharyngeal swab specimens and should not be used as a sole basis for treatment. Nasal washings and aspirates are unacceptable for Xpert Xpress SARS-CoV-2/FLU/RSV testing.  Fact Sheet for Patients: EntrepreneurPulse.com.au  Fact Sheet for Healthcare Providers: IncredibleEmployment.be  This test is not yet approved or cleared by the [place] FDA and has been authorized for detection and/or diagnosis of SARS-CoV-2 by FDA under an Emergency Use Authorization (EUA). This EUA will remain in effect (meaning this test can be used) for the duration of the COVID-19 declaration under Section 564(b)(1) of the Act, 21 U.S.C. section 360bbb-3(b)(1), unless the authorization is terminated or revoked.  Performed at [facility], [place] [address], [place], [place] [address]    SARSCOV2ONAVIRUS 2 AG 05/22/2021 NEGATIVE  NEGATIVE Final   Comment: (NOTE) SARS-CoV-2 antigen NOT DETECTED.   Negative results are presumptive.  Negative results do not preclude SARS-CoV-2 infection and should not be used as the sole basis  for treatment or other patient management decisions, including infection  control decisions, particularly in the presence of clinical signs and  symptoms consistent with COVID-19, or in those who have been in contact with the virus.  Negative  results must be combined with clinical observations, patient history, and epidemiological information. The expected result is Negative.  Fact Sheet for Patients: HandmadeRecipes.com.cy  Fact Sheet for Healthcare Providers: FuneralLife.at  This test is not yet approved or cleared by the [place] FDA and  has been authorized for detection and/or diagnosis of SARS-CoV-2 by FDA under an Emergency Use Authorization (EUA).  This EUA will remain in effect (meaning this test can be used) for the duration of  the COV                          ID-19 declaration under Section 564(b)(1) of the Act, 21 U.S.C. section 360bbb-3(b)(1), unless the authorization is terminated or revoked sooner.    Admission on 05/14/2021, Discharged on 05/15/2021  Component Date Value Ref Range Status   WBC 05/14/2021 14.5 (A) 4.0 - 10.5 K/uL Final   RBC 05/14/2021 4.91  4.22 - 5.81 MIL/uL Final   Hemoglobin 05/14/2021 14.3  13.0 - 17.0 g/dL Final   HCT 05/14/2021 41.0  39.0 - 52.0 % Final   MCV 05/14/2021 83.5  80.0 - 100.0 fL Final   MCH 05/14/2021 29.1  26.0 - 34.0 pg Final   MCHC 05/14/2021 34.9  30.0 - 36.0 g/dL Final   RDW 05/14/2021 11.6  11.5 - 15.5 % Final   Platelets 05/14/2021 382  150 - 400 K/uL Final   nRBC 05/14/2021 0.0  0.0 - 0.2 % Final   Neutrophils Relative % 05/14/2021 61  % Final   Neutro Abs 05/14/2021 8.9 (A) 1.7 - 7.7 K/uL Final   Lymphocytes Relative 05/14/2021 29  % Final   Lymphs Abs 05/14/2021 4.1 (A) 0.7 - 4.0 K/uL Final   Monocytes Relative 05/14/2021 9  % Final   Monocytes Absolute 05/14/2021 1.2 (A) 0.1 - 1.0 K/uL Final   Eosinophils Relative 05/14/2021 0  % Final   Eosinophils Absolute  05/14/2021 0.1  0.0 - 0.5 K/uL Final   Basophils Relative 05/14/2021 1  % Final   Basophils Absolute 05/14/2021 0.1  0.0 - 0.1 K/uL Final   Immature Granulocytes 05/14/2021 0  % Final   Abs Immature Granulocytes 05/14/2021 0.04  0.00 - 0.07 K/uL Final   Performed at [facility], [place] [address] [place], [place] [address]   Opiates 05/14/2021 NONE DETECTED  NONE DETECTED Final   Cocaine 05/14/2021 NONE DETECTED  NONE DETECTED Final   Benzodiazepines 05/14/2021 NONE DETECTED  NONE DETECTED Final   Amphetamines 05/14/2021 POSITIVE (A) NONE DETECTED Final   Tetrahydrocannabinol 05/14/2021 NONE DETECTED  NONE DETECTED Final   Barbiturates 05/14/2021 NONE DETECTED  NONE DETECTED Final   Comment: (NOTE) DRUG SCREEN FOR MEDICAL PURPOSES ONLY.  IF CONFIRMATION IS NEEDED FOR ANY PURPOSE, NOTIFY LAB WITHIN 5 DAYS.  LOWEST DETECTABLE LIMITS FOR URINE DRUG SCREEN Drug Class                     Cutoff (ng/mL) Amphetamine and metabolites    1000 Barbiturate and metabolites    200 Benzodiazepine                 [id number] Tricyclics and metabolites     300 Opiates and metabolites        300 Cocaine and metabolites        300 THC  50 Performed at [facility], [place] [address] [place], [place] [address]    Alcohol, Ethyl (B) 05/14/2021 <10  <10 mg/dL Final   Comment: (NOTE) Lowest detectable limit for serum alcohol is 10 mg/dL.  For medical purposes only. Performed at [facility], [place] [address] [place], [place] [address]    Sodium 05/14/2021 135  135 - 145 mmol/L Final   Potassium 05/14/2021 3.8  3.5 - 5.1 mmol/L Final   Chloride 05/14/2021 105  98 - 111 mmol/L Final   CO2 05/14/2021 23  22 - 32 mmol/L Final   Glucose, Bld 05/14/2021 118 (A) 70 - 99 mg/dL Final   Glucose reference range applies only to samples taken after fasting for at least 8 hours.   BUN 05/14/2021 21 (A) 6 - 20 mg/dL Final   Creatinine,  Ser 05/14/2021 0.79  0.61 - 1.24 mg/dL Final   Calcium 05/14/2021 10.0  8.9 - 10.3 mg/dL Final   Total Protein 05/14/2021 9.0 (A) 6.5 - 8.1 g/dL Final   Albumin 05/14/2021 4.2  3.5 - 5.0 g/dL Final   AST 05/14/2021 114 (A) 15 - 41 U/L Final   ALT 05/14/2021 106 (A) 0 - 44 U/L Final   Alkaline Phosphatase 05/14/2021 91  38 - 126 U/L Final   Total Bilirubin 05/14/2021 1.0  0.3 - 1.2 mg/dL Final   GFR, Estimated 05/14/2021 >60  >60 mL/min Final   Comment: (NOTE) Calculated using the CKD-EPI Creatinine Equation (2021)    Anion gap 05/14/2021 7  5 - 15 Final   Performed at [facility], [place] [address] [place] [address], [place], [place] [address]   SARS Coronavirus 2 by RT PCR 05/14/2021 NEGATIVE  NEGATIVE Final   Comment: (NOTE) SARS-CoV-2 target nucleic acids are NOT DETECTED.  The SARS-CoV-2 RNA is generally detectable in upper respiratory specimens during the acute phase of infection. The lowest concentration of SARS-CoV-2 viral copies this assay can detect is 138 copies/mL. A negative result does not preclude SARS-Cov-2 infection and should not be used as the sole basis for treatment or other patient management decisions. A negative result may occur with  improper specimen collection/handling, submission of specimen other than nasopharyngeal swab, presence of viral mutation(s) within the areas targeted by this assay, and inadequate number of viral copies(<138 copies/mL). A negative result must be combined with clinical observations, patient history, and epidemiological information. The expected result is Negative.  Fact Sheet for Patients:  EntrepreneurPulse.com.au  Fact Sheet for Healthcare Providers:  IncredibleEmployment.be  This test is no                          t yet approved or cleared by the [place] FDA and  has been authorized for detection and/or diagnosis of SARS-CoV-2 by FDA under an Emergency Use Authorization (EUA). This  EUA will remain  in effect (meaning this test can be used) for the duration of the COVID-19 declaration under Section 564(b)(1) of the Act, 21 U.S.C.section 360bbb-3(b)(1), unless the authorization is terminated  or revoked sooner.       Influenza A by PCR 05/14/2021 NEGATIVE  NEGATIVE Final   Influenza B by PCR 05/14/2021 NEGATIVE  NEGATIVE Final   Comment: (NOTE) The Xpert Xpress SARS-CoV-2/FLU/RSV plus assay is intended as an aid in the diagnosis of influenza from Nasopharyngeal swab specimens and should not be used as a sole basis for treatment. Nasal washings and aspirates are unacceptable for Xpert Xpress SARS-CoV-2/FLU/RSV  testing.  Fact Sheet for Patients: EntrepreneurPulse.com.au  Fact Sheet for Healthcare Providers: IncredibleEmployment.be  This test is not yet approved or cleared by the [place] FDA and has been authorized for detection and/or diagnosis of SARS-CoV-2 by FDA under an Emergency Use Authorization (EUA). This EUA will remain in effect (meaning this test can be used) for the duration of the COVID-19 declaration under Section 564(b)(1) of the Act, 21 U.S.C. section 360bbb-3(b)(1), unless the authorization is terminated or revoked.  Performed at [facility], [place] [address] [place], [place] [address]   Admission on 03/03/2021, Discharged on 03/04/2021  Component Date Value Ref Range Status   Sodium 03/04/2021 137  135 - 145 mmol/L Final   Potassium 03/04/2021 4.0  3.5 - 5.1 mmol/L Final   Chloride 03/04/2021 100  98 - 111 mmol/L Final   CO2 03/04/2021 26  22 - 32 mmol/L Final   Glucose, Bld 03/04/2021 104 (A) 70 - 99 mg/dL Final   Glucose reference range applies only to samples taken after fasting for at least 8 hours.   BUN 03/04/2021 15  6 - 20 mg/dL Final   Creatinine, Ser 03/04/2021 0.93  0.61 - 1.24 mg/dL Final   Calcium 03/04/2021 10.4 (A) 8.9 - 10.3 mg/dL Final   Total Protein  03/04/2021 9.4 (A) 6.5 - 8.1 g/dL Final   Albumin 03/04/2021 4.4  3.5 - 5.0 g/dL Final   AST 03/04/2021 65 (A) 15 - 41 U/L Final   ALT 03/04/2021 97 (A) 0 - 44 U/L Final   Alkaline Phosphatase 03/04/2021 85  38 - 126 U/L Final   Total Bilirubin 03/04/2021 1.0  0.3 - 1.2 mg/dL Final   GFR, Estimated 03/04/2021 >60  >60 mL/min Final   Comment: (NOTE) Calculated using the CKD-EPI Creatinine Equation (2021)    Anion gap 03/04/2021 11  5 - 15 Final   Performed at [facility] [address], [place], [place] [address]   Alcohol, Ethyl (B) 03/04/2021 <10  <10 mg/dL Final   Comment: (NOTE) Lowest detectable limit for serum alcohol is 10 mg/dL.  For medical purposes only. Performed at [facility], [place] [address] [place], [place] [address]    WBC 03/04/2021 12.6 (A) 4.0 - 10.5 K/uL Final   RBC 03/04/2021 5.16  4.22 - 5.81 MIL/uL Final   Hemoglobin 03/04/2021 15.3  13.0 - 17.0 g/dL Final   HCT 03/04/2021 46.1  39.0 - 52.0 % Final   MCV 03/04/2021 89.3  80.0 - 100.0 fL Final   MCH 03/04/2021 29.7  26.0 - 34.0 pg Final   MCHC 03/04/2021 33.2  30.0 - 36.0 g/dL Final   RDW 03/04/2021 11.9  11.5 - 15.5 % Final   Platelets 03/04/2021 417 (A) 150 - 400 K/uL Final   nRBC 03/04/2021 0.0  0.0 - 0.2 % Final   Performed at [facility] [address], [place], [place] [address]   Opiates 03/03/2021 POSITIVE (A) NONE DETECTED Final   Cocaine 03/03/2021 NONE DETECTED  NONE DETECTED Final   Benzodiazepines 03/03/2021 NONE DETECTED  NONE DETECTED Final   Amphetamines 03/03/2021 POSITIVE (A) NONE DETECTED Final   Tetrahydrocannabinol 03/03/2021 NONE DETECTED  NONE DETECTED Final   Barbiturates 03/03/2021 NONE DETECTED  NONE DETECTED Final   Comment: (NOTE) DRUG SCREEN FOR MEDICAL PURPOSES ONLY.  IF CONFIRMATION IS NEEDED FOR ANY PURPOSE, NOTIFY LAB WITHIN 5 DAYS.  LOWEST DETECTABLE LIMITS FOR URINE DRUG SCREEN Drug Class  Cutoff (ng/mL) Amphetamine and  metabolites    1000 Barbiturate and metabolites    200 Benzodiazepine                 [id number] Tricyclics and metabolites     300 Opiates and metabolites        300 Cocaine and metabolites        300 THC                            50 Performed at [facility], [place] [address] [place], [place] [address]   Admission on 02/20/2021, Discharged on 02/20/2021  Component Date Value Ref Range Status   SARS Coronavirus 2 by RT PCR 02/20/2021 NEGATIVE  NEGATIVE Final   Comment: (NOTE) SARS-CoV-2 target nucleic acids are NOT DETECTED.  The SARS-CoV-2 RNA is generally detectable in upper respiratory specimens during the acute phase of infection. The lowest concentration of SARS-CoV-2 viral copies this assay can detect is 138 copies/mL. A negative result does not preclude SARS-Cov-2 infection and should not be used as the sole basis for treatment or other patient management decisions. A negative result may occur with  improper specimen collection/handling, submission of specimen other than nasopharyngeal swab, presence of viral mutation(s) within the areas targeted by this assay, and inadequate number of viral copies(<138 copies/mL). A negative result must be combined with clinical observations, patient history, and epidemiological information. The expected result is Negative.  Fact Sheet for Patients:  EntrepreneurPulse.com.au  Fact Sheet for Healthcare Providers:  IncredibleEmployment.be  This test is no                          t yet approved or cleared by the [place] FDA and  has been authorized for detection and/or diagnosis of SARS-CoV-2 by FDA under an Emergency Use Authorization (EUA). This EUA will remain  in effect (meaning this test can be used) for the duration of the COVID-19 declaration under Section 564(b)(1) of the Act, 21 U.S.C.section 360bbb-3(b)(1), unless the authorization is terminated  or revoked sooner.       Influenza  A by PCR 02/20/2021 NEGATIVE  NEGATIVE Final   Influenza B by PCR 02/20/2021 NEGATIVE  NEGATIVE Final   Comment: (NOTE) The Xpert Xpress SARS-CoV-2/FLU/RSV plus assay is intended as an aid in the diagnosis of influenza from Nasopharyngeal swab specimens and should not be used as a sole basis for treatment. Nasal washings and aspirates are unacceptable for Xpert Xpress SARS-CoV-2/FLU/RSV testing.  Fact Sheet for Patients: EntrepreneurPulse.com.au  Fact Sheet for Healthcare Providers: IncredibleEmployment.be  This test is not yet approved or cleared by the [place] FDA and has been authorized for detection and/or diagnosis of SARS-CoV-2 by FDA under an Emergency Use Authorization (EUA). This EUA will remain in effect (meaning this test can be used) for the duration of the COVID-19 declaration under Section 564(b)(1) of the Act, 21 U.S.C. section 360bbb-3(b)(1), unless the authorization is terminated or revoked.  Performed at [facility], [place] [address] [place], [place] [address]    Sodium 02/20/2021 137  135 - 145 mmol/L Final   Potassium 02/20/2021 3.4 (A) 3.5 - 5.1 mmol/L Final   Chloride 02/20/2021 101  98 - 111 mmol/L Final   CO2 02/20/2021 28  22 - 32 mmol/L Final   Glucose, Bld 02/20/2021 108 (A) 70 - 99 mg/dL Final   Glucose reference range applies only to  samples taken after fasting for at least 8 hours.   BUN 02/20/2021 5 (A) 6 - 20 mg/dL Final   Creatinine, Ser 02/20/2021 0.74  0.61 - 1.24 mg/dL Final   Calcium 02/20/2021 9.2  8.9 - 10.3 mg/dL Final   GFR, Estimated 02/20/2021 >60  >60 mL/min Final   Comment: (NOTE) Calculated using the CKD-EPI Creatinine Equation (2021)    Anion gap 02/20/2021 8  5 - 15 Final   Performed at [facility], [place] [address], [place], [place] [address]   WBC 02/20/2021 7.6  4.0 - 10.5 K/uL Final   RBC 02/20/2021 4.39  4.22 - 5.81 MIL/uL Final   Hemoglobin 02/20/2021 13.1  13.0 - 17.0  g/dL Final   HCT 02/20/2021 40.4  39.0 - 52.0 % Final   MCV 02/20/2021 92.0  80.0 - 100.0 fL Final   MCH 02/20/2021 29.8  26.0 - 34.0 pg Final   MCHC 02/20/2021 32.4  30.0 - 36.0 g/dL Final   RDW 02/20/2021 12.0  11.5 - 15.5 % Final   Platelets 02/20/2021 400  150 - 400 K/uL Final   nRBC 02/20/2021 0.0  0.0 - 0.2 % Final   Neutrophils Relative % 02/20/2021 72  % Final   Neutro Abs 02/20/2021 5.4  1.7 - 7.7 K/uL Final   Lymphocytes Relative 02/20/2021 21  % Final   Lymphs Abs 02/20/2021 1.6  0.7 - 4.0 K/uL Final   Monocytes Relative 02/20/2021 6  % Final   Monocytes Absolute 02/20/2021 0.5  0.1 - 1.0 K/uL Final   Eosinophils Relative 02/20/2021 0  % Final   Eosinophils Absolute 02/20/2021 0.0  0.0 - 0.5 K/uL Final   Basophils Relative 02/20/2021 1  % Final   Basophils Absolute 02/20/2021 0.1  0.0 - 0.1 K/uL Final   Immature Granulocytes 02/20/2021 0  % Final   Abs Immature Granulocytes 02/20/2021 0.02  0.00 - 0.07 K/uL Final   Performed at [facility], [place] [address], [place], [place] [address]    Allergies: Patient has no known allergies.  PTA Medications: (Not in a hospital admission)   Medical Decision Making  Patient admitted to [facility] for crisis stabilization  Admission orders placed by previous provider  Initiate clonidine withdrawal protocol -clonidine taper -hydroxyzine 25 mg PO every 6 hours prn for anxiety -ondansetron 4 mg ODT every 6 hours prn nausea/vomiting -naproxen 500 mg BID prn for pain -dicyclomine 20 mg PO every 6 hours prn abdominal cramping -loperamide 2-4 mg capsule prn diarrhea -methocarbamol 500 mg PO every 8 hours prn spasms       Recommendations  Based on my evaluation the patient does not appear to have an emergency medical condition.  Rozetta Nunnery, NP 05/22/21  11:38 PM

## 2021-05-22 NOTE — ED Notes (Signed)
Patient provided with Snacks and [employer] as requested. Patient comfortable at this Time. Call [place] within reach. Staff will continue to Monitor.

## 2021-05-22 NOTE — Progress Notes (Signed)
Patient presents as a walk in for assessment. Unable to complete demographic information/med/mental health history form. Unable to sign waiver of medical screening exam form.   05/22/21 1415  Vitals  Temp 97.9 F (36.6 C)  Temp Source Oral  BP 107/76  BP Location Right Arm  BP Method Automatic  Patient Position (if appropriate) Sitting  Pulse Rate 82  Pulse Rate Source Dinamap  Resp 16  MEWS COLOR  MEWS Score Color Green  Oxygen Therapy  SpO2 98 %  O2 Device Room Air  MEWS Score  MEWS Temp 0  MEWS Systolic 0  MEWS Pulse 0  MEWS RR 0  MEWS LOC 0  MEWS Score 0

## 2021-05-22 NOTE — ED Notes (Signed)
This RN presented the AVS utilizing Teachback Method. Patient verbalizes understanding of Discharge Instructions. Opportunity for Questioning and Answers were provided. Patient currently attempting to find a Friend/Relative to provide transportation and housing.

## 2021-05-22 NOTE — ED Provider Notes (Signed)
[facility] EMERGENCY DEPT Provider Note   CSN: [id number] Arrival date & time: 05/21/21  2137     History Chief Complaint  Patient presents with   Withdrawal    Calvin Sanchez is a 30 y.o. male.  Patient presents to the emergency department for evaluation of opioid addiction.  Patient requesting detox.  He last used approximately an hour before coming to the ED.  Patient also reports an underlying homelessness with no place to go.  He has not homicidal or suicidal.      Past Medical History:  Diagnosis Date   Homeless    Substance abuse [facility])     Patient Active Problem List   Diagnosis Date Noted   Homelessness 03/26/2021   Substance induced mood disorder ([place]) 03/26/2021   Passive suicidal ideations 03/26/2021   Cellulitis of arm 12/29/2019   Polysubstance abuse ([place]) 12/29/2019   IVDU (intravenous drug user) 12/29/2019   Sepsis ([place]) 12/29/2019   Leukocytosis 12/29/2019   Thrombocytosis 12/29/2019   Abnormal LFTs 12/29/2019   Hyperglycemia 12/29/2019   Left arm cellulitis 12/29/2019    Past Surgical History:  Procedure Laterality Date   KNEE SURGERY Left        Family History  Problem Relation Age of Onset   Hypertension Mother     Social History   Tobacco Use   Smoking status: Every Day    Types: Cigars   Smokeless tobacco: Never  Vaping Use   Vaping Use: Never used  Substance Use Topics   Alcohol use: Yes    Comment: Once a week. Last drink: yesterday    Drug use: Yes    Types: Cocaine, Methamphetamines    Comment: heroin    Home Medications Prior to Admission medications   Medication Sig Start Date End Date Taking? Authorizing Provider  cloNIDine (CATAPRES) 0.1 MG tablet For opioid withdrawal symptoms: 1 tab po tid x 2 days, then bid x 2 days, then once daily x 2 days 05/22/21  Yes Dandre Sisler, Gwenyth Allegra, MD  promethazine (PHENERGAN) 25 MG tablet Take 1 tablet (25 mg total) by mouth every 6 (six) hours as needed for  nausea or vomiting. 05/22/21  Yes Laruth Hanger, Gwenyth Allegra, MD    Allergies    Patient has no known allergies.  Review of Systems   Review of Systems  Psychiatric/Behavioral:  Negative for suicidal ideas.   All other systems reviewed and are negative.  Physical Exam Updated Vital Signs BP 116/82 (BP Location: Right Arm)   Pulse 99   Temp 98.4 F (36.9 C) (Oral)   Resp 16   Ht 6' (1.829 m)   Wt 70.8 kg   SpO2 97%   BMI 21.17 kg/m   Physical Exam Vitals and nursing note reviewed.  Constitutional:      General: He is not in acute distress.    Appearance: Normal appearance. He is well-developed.  HENT:     Head: Normocephalic and atraumatic.     Right Ear: Hearing normal.     Left Ear: Hearing normal.     Nose: Nose normal.  Eyes:     Conjunctiva/sclera: Conjunctivae normal.     Pupils: Pupils are equal, round, and reactive to light.  Cardiovascular:     Rate and Rhythm: Regular rhythm.     Heart sounds: S1 normal and S2 normal. No murmur heard.   No friction rub. No gallop.  Pulmonary:     Effort: Pulmonary effort is normal. No respiratory distress.  Breath sounds: Normal breath sounds.  Chest:     Chest wall: No tenderness.  Abdominal:     General: Bowel sounds are normal.     Palpations: Abdomen is soft.     Tenderness: There is no abdominal tenderness. There is no guarding or rebound. Negative signs include Murphy's sign and McBurney's sign.     Hernia: No hernia is present.  Musculoskeletal:        General: Normal range of motion.     Cervical back: Normal range of motion and neck supple.  Skin:    General: Skin is warm and dry.     Findings: No rash.  Neurological:     Mental Status: He is alert and oriented to person, place, and time.     GCS: GCS eye subscore is 4. GCS verbal subscore is 5. GCS motor subscore is 6.     Cranial Nerves: No cranial nerve deficit.     Sensory: No sensory deficit.     Coordination: Coordination normal.  Psychiatric:         Speech: Speech normal.        Behavior: Behavior normal.        Thought Content: Thought content normal.    ED Results / Procedures / Treatments   Labs (all labs ordered are listed, but only abnormal results are displayed) Labs Reviewed - No data to display  EKG None  Radiology No results found.  Procedures Procedures   Medications Ordered in ED Medications - No data to display  ED Course  I have reviewed the triage vital signs and the nursing notes.  Pertinent labs & imaging results that were available during my care of the patient were reviewed by me and considered in my medical decision making (see chart for details).    MDM Rules/Calculators/A&P                           Patient requesting detox.  He was informed that we do not provide any detox but will provide him with resources for outpatient or inpatient treatment elsewhere.  He can be provided with prescriptions for medications to reduce symptoms if he chooses not to use any further opioids.  As he is homeless, will be allowed to sleep in the department overnight, discharge in the morning.  He was provided food and drink.  Final Clinical Impression(s) / ED Diagnoses Final diagnoses:  Opioid abuse ([place])    Rx / DC Orders ED Discharge Orders          Ordered    cloNIDine (CATAPRES) 0.1 MG tablet        05/22/21 0556    promethazine (PHENERGAN) 25 MG tablet  Every 6 hours PRN        05/22/21 0556             Orpah Greek, MD 05/23/21 [phone]

## 2021-05-22 NOTE — ED Notes (Signed)
Pt admitted to [facility] due to SI and polysubstance use. Pt A&O x4, calm and cooperative. Pt endorses SI with plan to hang self and is unable to contract for safety. Pt denies HI/AVH. Pt ambulated independently to unit. Oriented to unit/staff. PB&J sandwich, applesauce, and water given per pt request. Pt denies any further needs at this time. No signs of acute distress noted. Will continue to monitor for safety.

## 2021-05-22 NOTE — BH Assessment (Signed)
Comprehensive Clinical Assessment (CCA) Note  05/22/2021 Nell Range [id number]  Disposition: Per Pecolia Ades, NP patient is being referred to [facility] for safety, stabilization and time to clear from substances.   Awaiting response from [facility] program.  The patient demonstrates the following risk factors for suicide: Chronic risk factors for suicide include: psychiatric disorder of polysubstance abuse and depression . Acute risk factors for suicide include: social withdrawal/isolation and loss (financial, interpersonal, professional). Protective factors for this patient include: positive social support and coping skills. Considering these factors, the overall suicide risk at this point appears to be moderate. Patient is appropriate for outpatient follow up once stabilized.   Patient is a 30 year old male with a history of polysubstance abuse and depression who presents voluntarily to [facility] for assessment.  Patient presents reporting SI in the context of being tired of using.  He has been using heroin and methamphetamines almost daily for the past 6 months, after 2 yrs clean in the [facility] program.  Patient states is embarrassed that he has "failed" and has suicidal thoughts with a plan to hang himself.  Prior attempt at age 19. Patient reports using meth last night and almost 1 gram of heroin IV.  Patient with unsteady gait on arrival, however this has improved. Patient states he left [facility] and transitioned to a halfway house, however other residents brought in drugs and he relapsed within 2 weeks of completing treatment.  Patient denies any other concerns and is requesting inpatient admission.  He does not provide collateral contacts to obtain collateral.    Chief Complaint:  Chief Complaint  Patient presents with   Psychiatric Evaluation   Visit Diagnosis:  Depressive Disorder Unspecified  Flowsheet Row OP Visit from 05/22/2021 in [place]   Thoughts that you would be better off dead, or of hurting yourself in some way More than half the days  PHQ-9 Total Score 13      Flowsheet Row OP Visit from 05/22/2021 in [place] ED from 05/21/2021 in [facility] ED from 05/14/2021 in [facility] [facility]  C-SSRS RISK CATEGORY High Risk No Risk No Risk      CCA Screening, Triage and Referral (STR)  Patient Reported Information How did you hear about [place]? Self  What Is the Reason for Your Visit/Call Today? Patient presents reporting SI in the context of being tired of using.  He has been using heroin and methamphetamines almost daily for the past 6 months, after 2 yrs clean in the [facility] program.  Patient states is embarrassed that he has "failed" and has suicidal thoughts with a plan to hang himself.  Prior attempt at age 18. Patient reports using meth last night and almost 1 gram of heroin IV.  Patient with unsteady gait on arrival, however this has improved.  How Long Has This Been Causing You Problems? > than 6 months  What Do You Feel Would Help You the Most Today? Alcohol or Drug Use Treatment; Treatment for Depression or other mood problem   Have You Recently Had Any Thoughts About Hurting Yourself? Yes  Are You Planning to Commit Suicide/Harm Yourself At This time? Yes   Have you Recently Had Thoughts About Hurting Someone [place] Dawn? No  Are You Planning to Harm Someone at This Time? No  Explanation: No data recorded  Have You Used Any Alcohol or Drugs in the Past 24 Hours? Yes  How Long Ago Did You  Use Drugs or Alcohol? No data recorded What Did You Use and How Much? IV heroin use - last night almost 1 gram IV and meth last night (amt?)   Do You Currently Have a Therapist/Psychiatrist? No  Name of Therapist/Psychiatrist: No data recorded  Have You Been Recently Discharged From Any Office Practice or Programs? No  Explanation of  Discharge From Practice/Program: No data recorded    CCA Screening Triage Referral Assessment Type of Contact: Face-to-Face  Telemedicine Service Delivery:   Is this Initial or Reassessment? No data recorded Date Telepsych consult ordered in [facility]:  No data recorded Time Telepsych consult ordered in [facility]:  No data recorded Location of Assessment: [facility]  Provider Location: [facility]   Collateral Involvement: N/A   Does Patient Have a [place]? No data recorded Name and Contact of Legal Guardian: No data recorded If Minor and Not Living with Parent(s), Who has Custody? No data recorded Is CPS involved or ever been involved? Never  Is APS involved or ever been involved? Never   Patient Determined To Be At Risk for Harm To Self or Others Based on Review of Patient Reported Information or Presenting Complaint? Yes, for Self-Harm  Method: No data recorded Availability of Means: No data recorded Intent: No data recorded Notification Required: No data recorded Additional Information for Danger to Others Potential: No data recorded Additional Comments for Danger to Others Potential: No data recorded Are There Guns or Other Weapons in Your Home? No data recorded Types of Guns/Weapons: No data recorded Are These Weapons Safely Secured?                            No data recorded Who Could Verify You Are Able To Have These Secured: No data recorded Do You Have any Outstanding Charges, Pending Court Dates, Parole/Probation? No data recorded Contacted To Inform of Risk of Harm To Self or Others: Other: Comment [facility] staff aware of safety concerns)    Does Patient Present under Involuntary Commitment? No  IVC Papers Initial File Date: No data recorded  [place] of Residence: [place]   Patient Currently Receiving the Following Services: Not Receiving Services   Determination of Need: Urgent (48 hours)   Options For  Referral: Facility-Based Crisis; Chemical Dependency Intensive Outpatient Therapy (CDIOP); Medication Management; Outpatient Therapy     CCA Biopsychosocial Patient Reported Schizophrenia/Schizoaffective Diagnosis in Past: No   Strengths: Seeking treatment, insightful   Mental Health Symptoms Depression:   Hopelessness; Irritability; Tearfulness   Duration of Depressive symptoms:  Duration of Depressive Symptoms: Greater than two weeks   Mania:   None   Anxiety:    Worrying; Tension   Psychosis:   None   Duration of Psychotic symptoms:    Trauma:   None   Obsessions:   None   Compulsions:   None   Inattention:   None   Hyperactivity/Impulsivity:   None   Oppositional/Defiant Behaviors:   None   Emotional Irregularity:   None   Other Mood/Personality Symptoms:  No data recorded   Mental Status Exam Appearance and self-care  Stature:   Average   Weight:   Average weight   Clothing:   Dirty; Disheveled   Grooming:   Neglected   Cosmetic use:   None   Posture/gait:   Normal   Motor activity:   Not Remarkable   Sensorium  Attention:   Normal   Concentration:  Normal   Orientation:   X5   Recall/memory:   Normal   Affect and Mood  Affect:   Depressed   Mood:   Depressed   Relating  Eye contact:   Avoided   Facial expression:   Depressed   Attitude toward examiner:   Cooperative   Thought and Language  Speech flow:  Clear and Coherent   Thought content:   Appropriate to Mood and Circumstances   Preoccupation:   None   Hallucinations:   None   Organization:  No data recorded  [employer] of Knowledge:   Fair   Intelligence:   Average   Abstraction:   Normal   Judgement:   Impaired   Reality Testing:   Adequate   Insight:   Gaps   Decision Making:   Impulsive   Social Functioning  Social Maturity:   Irresponsible   Social Judgement:   "Games developer"; Victimized    Stress  Stressors:   Family conflict; Housing; Teacher, music Ability:   Advice worker Deficits:   None   Supports:   Friends/Service system     Religion: Religion/Spirituality Are You A Religious Person?: No  Leisure/Recreation: Leisure / Recreation Do You Have Hobbies?: No  Exercise/Diet: Exercise/Diet Do You Exercise?: No Have You Gained or Lost A Significant Amount of Weight in the Past Six Months?: No Do You Follow a Special Diet?: No Do You Have Any Trouble Sleeping?:  (NA)   CCA Employment/Education Employment/Work Situation: Employment / Work Situation Employment Situation: Unemployed Patient's Job has Been Impacted by Current Illness: No Has Patient ever Been in Passenger transport manager?: No  Education: Education Is Patient Currently Attending School?: No Did Physicist, medical?: No Did You Have An Individualized Education Program (IIEP): No Did You Have Any Difficulty At [employer]?: No Patient's Education Has Been Impacted by Current Illness: No   CCA Family/Childhood History Family and Relationship History: Family history Marital status: Single Does patient have children?:  (UTA)  Childhood History:  Childhood History By whom was/is the patient raised?: Mother Did patient suffer any verbal/emotional/physical/sexual abuse as a child?: No Did patient suffer from severe childhood neglect?: No Has patient ever been sexually abused/assaulted/raped as an adolescent or adult?: No Was the patient ever a victim of a crime or a disaster?: No Witnessed domestic violence?: No Has patient been affected by domestic violence as an adult?: No  Child/Adolescent Assessment:     CCA Substance Use Alcohol/Drug Use: Alcohol / Drug Use Pain Medications: See MAR Prescriptions: See MAR Over the Counter: See MAR History of alcohol / drug use?: Yes Longest period of sobriety (when/how long): Patient is unclear on current use patters outside of stating he is using  IV heroin most days (amt unknown) and meth most days (amt uknown) for past 6 months.  He was clean 2 yrs prior to this 6 month period. Negative Consequences of Use: Financial Withdrawal Symptoms: Weakness (Back pain.)    ASAM's:  Six Dimensions of Multidimensional Assessment  Dimension 1:  Acute Intoxication and/or Withdrawal Potential:   Dimension 1:  Description of individual's past and current experiences of substance use and withdrawal: current weakness, reports related to heroin use last night - risk for opiate w/d  Dimension 2:  Biomedical Conditions and Complications:      Dimension 3:  Emotional, Behavioral, or Cognitive Conditions and Complications:     Dimension 4:  Readiness to Change:     Dimension 5:  Relapse,  Continued use, or Continued Problem Potential:     Dimension 6:  Recovery/Living Environment:     ASAM Severity Score: ASAM's Severity Rating Score: 15  ASAM Recommended Level of Treatment: ASAM Recommended Level of Treatment: Level II Intensive Outpatient Treatment   Substance use Disorder (SUD)    Recommendations for Services/Supports/Treatments: Recommendations for Services/Supports/Treatments Recommendations For Services/Supports/Treatments: CST Marine scientist), Medication Management, Individual Therapy, SAIOP (Substance Abuse Intensive Outpatient Program)  Discharge Disposition:    DSM5 Diagnoses: Patient Active Problem List   Diagnosis Date Noted   Homelessness 03/26/2021   Substance induced mood disorder ([place]) 03/26/2021   Passive suicidal ideations 03/26/2021   Cellulitis of arm 12/29/2019   Polysubstance abuse ([place]) 12/29/2019   IVDU (intravenous drug user) 12/29/2019   Sepsis ([place]) 12/29/2019   Leukocytosis 12/29/2019   Thrombocytosis 12/29/2019   Abnormal LFTs 12/29/2019   Hyperglycemia 12/29/2019   Left arm cellulitis 12/29/2019     Referrals to Alternative Service(s):  [facility], [facility]

## 2021-05-22 NOTE — ED Notes (Signed)
Dash called

## 2021-05-22 NOTE — ED Notes (Signed)
Pt admits to IV drug use that has caused the multiple scars on legs and arms

## 2021-05-23 DIAGNOSIS — F1520 Other stimulant dependence, uncomplicated: Secondary | ICD-10-CM | POA: Diagnosis not present

## 2021-05-23 DIAGNOSIS — F1994 Other psychoactive substance use, unspecified with psychoactive substance-induced mood disorder: Secondary | ICD-10-CM | POA: Diagnosis not present

## 2021-05-23 DIAGNOSIS — F1190 Opioid use, unspecified, uncomplicated: Secondary | ICD-10-CM | POA: Diagnosis not present

## 2021-05-23 DIAGNOSIS — Z20822 Contact with and (suspected) exposure to covid-19: Secondary | ICD-10-CM | POA: Diagnosis not present

## 2021-05-23 LAB — GC/CHLAMYDIA PROBE AMP (~~LOC~~) NOT AT ARMC
Chlamydia: NEGATIVE
Comment: NEGATIVE
Comment: NORMAL
Neisseria Gonorrhea: NEGATIVE

## 2021-05-23 LAB — RPR: RPR Ser Ql: NONREACTIVE

## 2021-05-23 MED ORDER — METHOCARBAMOL 750 MG PO TABS: 750.0000 mg | ORAL_TABLET | Freq: Three times a day (TID) | ORAL | Status: DC | PRN

## 2021-05-23 NOTE — Progress Notes (Addendum)
Pt is asleep. Respirations are even and unlabored. Pt complained of general body ache and was unable to get out of bed. Pt's COWS was 5. Pt was able to rouse after a lot of encouragement. Administered PRN Naproxen and scheduled medication with no incident. Pt endorses SI with plan to hang self or jump in front of a traffic. Pt verbally contracts for safety on the unit. Pt denies HI/AVH at this time. Staff will monitor for pt's safety.

## 2021-05-23 NOTE — ED Provider Notes (Addendum)
[facility] Admission Suicide Risk Assessment   Nursing information obtained from:   chart Current Mental Status:   reporting SI  Mental Status Per Nursing Assessment::   On Admission:   +SI Demographic Factors:  Male and Low socioeconomic status  Loss Factors: Financial problems/change in socioeconomic status  Historical Factors: Prior suicide attempts and Impulsivity  Risk Reduction Factors:   Positive social support and Positive coping skills or problem solving skills    Total Time spent with patient: 15 minutes Principal Problem: Substance induced mood disorder ([place]) Diagnosis:  Principal Problem:   Substance induced mood disorder ([place]) Active Problems:   IVDU (intravenous drug user)  Subjective Data:  Per H&P 05/22/21 Calvin Sanchez is a 30 y.o. 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 Patient was evaluated by Pecolia Ades, NP and recommended for transfer to [facility].    On initial evaluation by NP at the [facility],  patient is alert and oriented x 4, pleasant, and cooperative. Speech is clear and coherent. Mood is depressed and affect is congruent with mood. Thought process is coherent and thought content is logical. Denies auditory and visual hallucinations. No indication that patient is responding to internal stimuli. No evidence of delusional thought content. Reports passive SI without intent or plan. Denies homicidal ideations. Reports daily use of methamphetamine and heroin. Reports IV injection of heroin. Last use yesterday. UDS positive for amphetamine, methamphetamine, oxazepam, morphine, and marijuana.   Per CCA 05/22/21 Patient is a 30 year old male with a history of polysubstance abuse and depression who presents voluntarily to [facility] for assessment.  Patient presents reporting SI in the context of being tired of using.  He has been using heroin and methamphetamines almost daily for the past 6  months, after 2 yrs clean in the [facility] program.  Patient states is embarrassed that he has "failed" and has suicidal thoughts with a plan to hang himself.  Prior attempt at age 73. Patient reports using meth last night and almost 1 gram of heroin IV.  Patient with unsteady gait on arrival, however this has improved. Patient states he left [facility] and transitioned to a halfway house, however other residents brought in drugs and he relapsed within 2 weeks of completing treatment.  Patient denies any other concerns and is requesting inpatient admission.  He does not provide collateral contacts to obtain collateral.    Today On interview this AM, patient appears in moderate distress and objectively appears to be going through opioid withdrawal. He is mildly irritable. He reports  nausea, muscle aches, lacrimation, rhinorrhea, sweating, insomnia and subjective fever. VSS. Patient denies HI/AVH. He reports ongoing SI with "the same plan". He requests medication for nausea-RN notified.  Most recent COWS 5 @ 848 this morning.     Continued Clinical Symptoms:    The "Alcohol Use Disorders Identification Test", Guidelines for Use in Primary Care, Second Edition.  World Pharmacologist [facility]). Score between 0-7:  no or low risk or alcohol related problems. Score between 8-15:  moderate risk of alcohol related problems. Score between 16-19:  high risk of alcohol related problems. Score 20 or above:  warrants further diagnostic evaluation for alcohol dependence and treatment.   CLINICAL FACTORS:   Depression:   Comorbid alcohol abuse/dependence Alcohol/Substance Abuse/Dependencies   Musculoskeletal: Strength & Muscle Tone: within normal limits Gait & Station: normal Patient leans: N/A  Psychiatric Specialty Exam:  Presentation  General Appearance: Appropriate for Environment;  Disheveled  Eye Contact:Other (comment); Poor (limited. eyes closed the majority of interview)  Speech:Clear and  Coherent; Normal Rate  Speech Volume:Decreased  Handedness:Right   Mood and Affect  Mood:Anxious; Depressed; Dysphoric  Affect:Congruent   Thought Process  Thought Processes:Coherent; Goal Directed; Linear  Descriptions of Associations:Intact  Orientation:Full (Time, Place and Person)  Thought Content:WDL  History of Schizophrenia/Schizoaffective disorder:No  Duration of Psychotic Symptoms:No data recorded Hallucinations:Hallucinations: None  Ideas of Reference:None  Suicidal Thoughts:Suicidal Thoughts: Yes, Active SI Passive Intent and/or Plan: Without Intent; Without Plan  Homicidal Thoughts:Homicidal Thoughts: No   Sensorium  Memory:Immediate Good; Recent Good; Remote Fair  Judgment:Intact  Insight:Present   Executive Functions  Concentration:Fair  Attention Span:Fair  [place]   Psychomotor Activity  Psychomotor Activity:Psychomotor Activity: Normal   Assets  Assets:Communication Skills; Desire for Improvement; Physical Health   Sleep  Sleep:Sleep: Poor    Physical Exam: Physical Exam Constitutional:      Appearance: He is normal weight.     Comments: Moderate distress, appears to be in active opioid withdrawal  HENT:     Head: Normocephalic and atraumatic.  Eyes:     Extraocular Movements: Extraocular movements intact.  Cardiovascular:     Rate and Rhythm: Normal rate and regular rhythm.     Heart sounds: Normal heart sounds.  Pulmonary:     Effort: Pulmonary effort is normal.  Abdominal:     General: Abdomen is flat. Bowel sounds are normal.  Neurological:     General: No focal deficit present.   Review of Systems  Constitutional:  Positive for chills, diaphoresis, fever and malaise/fatigue.       +subjective fever   HENT:  Negative for hearing loss.   Eyes:  Negative for discharge and redness.  Respiratory:  Negative for cough.   Cardiovascular:  Negative for chest pain.   Gastrointestinal:  Positive for nausea. Negative for abdominal pain and vomiting.  Musculoskeletal:  Negative for myalgias.  Neurological:  Negative for headaches.  Psychiatric/Behavioral:  Positive for depression, substance abuse and suicidal ideas. Negative for hallucinations. The patient is nervous/anxious and has insomnia.   Blood pressure 116/73, pulse 77, temperature 99 F (37.2 C), temperature source Oral, resp. rate 16, SpO2 100 %. There is no height or weight on file to calculate BMI.   COGNITIVE FEATURES THAT CONTRIBUTE TO RISK:  Thought constriction (tunnel vision)    SUICIDE RISK:   Moderate:  Frequent suicidal ideation with limited intensity, and duration, some specificity in terms of plans, no associated intent, good self-control, limited dysphoria/symptomatology, some risk factors present, and identifiable protective factors, including available and accessible social support.  PLAN OF CARE:  30 yo male with a history of substance who presented to [facility] on 9/13 requesting detox from opioids and reported SI with a plan to hang himself. Patient as accepted to [facility] 9/13 for continued treatment. Labs reviewed-CBC, lipid panel unremarkable. TSH wnl (0.405), RPR nonreactive, a1c wnl (5.4). CBC with mildly elevated AST and ALT at 94 and 108 respectively. UDS+ amphetamine, methamphetamine, oxazepam, morphine, and marijuana. GC/chlamydia in process. Patient as started on clonidine taper by overnight provider. VSS. This morning, patient report sx c/w opioid withdrawal and is somewhat irritable and unable to participate in full interview today. Will obtain additional information tomorrow in regards to mood symptoms, psychiatric treatment and additional  history.. Will continue symptomatic treatment of opioid withdrawal at this time and order hepatitis panel given IV drug use.  Opiate Withdrawal Protocol: -continue  Clonidine 0.'1mg'$  PO taper -Bentyl '10mg'$  PO q6hr PRN for abdominal muscle  cramps -Loperamide '2mg'$  PO q6hr PRN for diarrhea -Robaxin '750mg'$  PO q8hr PRN for muscle spasm -Zofran '4mg'$  SL q8hr PRN for nausea -Vistaril '25mg'$  PO q8hr PRN for anxiety   I certify that inpatient services furnished can reasonably be expected to improve the patient's condition.   Ival Bible, MD 05/23/2021, 1:33 PM

## 2021-05-23 NOTE — ED Notes (Signed)
Pt asleep in bed. Respirations even and unlabored. Will continue to monitor for safety. ?

## 2021-05-23 NOTE — Clinical Social Work Psych Note (Signed)
CSW Note  CSW met with Calvin Sanchez for introduction and to begin discussions regarding possible discharge planning.   Calvin Sanchez presented irritable and drowsy, however he was pleasant and cooperative with this Probation officer. Calvin Sanchez continues to endorse suicidal ideations at this time. He also reports that he does not feel well physically or mentally. Patient is aware that he is experiencing possible withdrawal symptoms.   Calvin Sanchez shared that he came to this facility because "I need help man". Calvin Sanchez endorsed having substance use issues with various substances, however his main two choices recently have been heroin and methamphetamines.   Calvin Sanchez shared that he recently completed the [facility] 2-year program 6 months ago, however shortly after completion he relapsed after being presented with drugs while in his transitional living placement.   Calvin Sanchez reports that he would like to be referred to residential treatment facilities for possible placement. CSW informed Calvin Sanchez that his information would be sent to appropriate facilities. Calvin Sanchez was agreeable. CSW also shared that it would be important for him to continue to follow outpatient providers regarding his medications and therapy to ensure he maintains sobriety once he completes a residential program. Calvin Sanchez was agreeable to additional resources.   CSW will continue to follow for possible placement.    Radonna Ricker, MSW, LCSW Clinical Education officer, museum ([place]) [facility]

## 2021-05-23 NOTE — Progress Notes (Signed)
Patient remains asleep in bed with moderate opiate withdrawal symptoms.  He just received standing clonidine.  V.s. stable and he has not exhibited vomiting or diarrhea and no piloerection noted.  Calm and cooperative.

## 2021-05-23 NOTE — ED Notes (Signed)
Pt resting in bed. Respirations even and unlabored. Will continue to monitor for safety.  

## 2021-05-23 NOTE — ED Provider Notes (Signed)
Behavioral Health Progress Note  Date and Time: 05/23/2021 1:35 PM Name: Calvin Sanchez MRN:  [id number]  Subjective:  Patient seen and chart reviewed. He was transferred to [facility] from [facility] for detox and reported SI with a plan to hang himself.   On interview this AM, patient appears in moderate distress and objectively appears to be going through opioid withdrawal. He is mildly irritable. He reports  nausea, muscle aches, lacrimation, rhinorrhea, sweating, insomnia and subjective fever. VSS. Patient denies HI/AVH. He reports ongoing SI with "the same plan". He requests medication for nausea-RN notified.   Most recent COWS 5 @ 848 this morning.   UDS positive for amphetamine, methamphetamine, oxazepam, morphine, and marijuana.    Diagnosis:  Final diagnoses:  Substance induced mood disorder ([name])  Heroin use disorder, severe ([name])  Methamphetamine use disorder, severe ([name])    Total Time spent with patient: 15 minutes  Past Psychiatric History: see H&P Past Medical History:  Past Medical History:  Diagnosis Date   Homeless    Substance abuse ([place])     Past Surgical History:  Procedure Laterality Date   KNEE SURGERY Left    Family History:  Family History  Problem Relation Age of Onset   Hypertension Mother    Family Psychiatric  History: see H&P Social History:  Social History   Substance and Sexual Activity  Alcohol Use Yes   Comment: Once a week. Last drink: yesterday      Social History   Substance and Sexual Activity  Drug Use Yes   Types: Cocaine, Methamphetamines   Comment: heroin    Social History   Socioeconomic History   Marital status: Single    Spouse name: Not on file   Number of children: Not on file   Years of education: Not on file   Highest education level: Not on file  Occupational History   Not on file  Tobacco Use   Smoking status: Every Day    Types: Cigars   Smokeless tobacco: Never  Vaping Use   Vaping Use: Never used   Substance and Sexual Activity   Alcohol use: Yes    Comment: Once a week. Last drink: yesterday    Drug use: Yes    Types: Cocaine, Methamphetamines    Comment: heroin   Sexual activity: Not on file  Other Topics Concern   Not on file  Social History Narrative   Not on file   Social Determinants of Health   Financial Resource Strain: Not on file  Food Insecurity: Not on file  Transportation Needs: Not on file  Physical Activity: Not on file  Stress: Not on file  Social Connections: Not on file   SDOH:  SDOH Screenings   Alcohol Screen: Not on file  Depression (PHQ2-9): Medium Risk   PHQ-2 Score: 13  Financial Resource Strain: Not on file  Food Insecurity: Not on file  Housing: Not on file  Physical Activity: Not on file  Social Connections: Not on file  Stress: Not on file  Tobacco Use: High Risk   Smoking Tobacco Use: Every Day   Smokeless Tobacco Use: Never  Transportation Needs: Not on file   Additional Social History:                         Sleep: Poor  Appetite:  Fair  Current Medications:  Current Facility-Administered Medications  Medication Dose Route Frequency Provider Last Rate Last Admin   acetaminophen (TYLENOL) tablet  650 mg  650 mg Oral Q6H PRN Revonda Humphrey, NP       alum & mag hydroxide-simeth (MAALOX/MYLANTA) 200-200-20 MG/5ML suspension 30 mL  30 mL Oral Q4H PRN Revonda Humphrey, NP       cloNIDine (CATAPRES) tablet 0.1 mg  0.1 mg Oral QID Revonda Humphrey, NP   0.1 mg at 05/23/21 [id number]   Followed by   Derrill Memo ON 05/25/2021] cloNIDine (CATAPRES) tablet 0.1 mg  0.1 mg Oral BH-qamhs Revonda Humphrey, NP       Followed by   Derrill Memo ON 05/27/2021] cloNIDine (CATAPRES) tablet 0.1 mg  0.1 mg Oral QAC breakfast Revonda Humphrey, NP       dicyclomine (BENTYL) tablet 20 mg  20 mg Oral Q6H PRN Revonda Humphrey, NP       hydrOXYzine (ATARAX/VISTARIL) tablet 25 mg  25 mg Oral Q6H PRN Revonda Humphrey, NP       loperamide  (IMODIUM) capsule 2-4 mg  2-4 mg Oral PRN Revonda Humphrey, NP       magnesium hydroxide (MILK OF MAGNESIA) suspension 30 mL  30 mL Oral Daily PRN Revonda Humphrey, NP       methocarbamol (ROBAXIN) tablet 750 mg  750 mg Oral Q8H PRN Ival Bible, MD       naproxen (NAPROSYN) tablet 500 mg  500 mg Oral BID PRN Revonda Humphrey, NP   500 mg at 05/23/21 0858   ondansetron (ZOFRAN-ODT) disintegrating tablet 4 mg  4 mg Oral Q6H PRN Revonda Humphrey, NP   4 mg at 05/23/21 1136   traZODone (DESYREL) tablet 50 mg  50 mg Oral QHS PRN Revonda Humphrey, NP       Current Outpatient Medications  Medication Sig Dispense Refill   cloNIDine (CATAPRES) 0.1 MG tablet For opioid withdrawal symptoms: 1 tab po tid x 2 days, then bid x 2 days, then once daily x 2 days (Patient taking differently: Take 0.1 mg by mouth See admin instructions. For opioid withdrawal symptoms: 1 tab po tid x 2 days, then bid x 2 days, then once daily x 2 days) 12 tablet 0   promethazine (PHENERGAN) 25 MG tablet Take 1 tablet (25 mg total) by mouth every 6 (six) hours as needed for nausea or vomiting. 15 tablet 0    Labs  Lab Results:  Admission on 05/22/2021  Component Date Value Ref Range Status   WBC 05/22/2021 6.9  4.0 - 10.5 K/uL Final   RBC 05/22/2021 4.72  4.22 - 5.81 MIL/uL Final   Hemoglobin 05/22/2021 13.7  13.0 - 17.0 g/dL Final   HCT 05/22/2021 40.7  39.0 - 52.0 % Final   MCV 05/22/2021 86.2  80.0 - 100.0 fL Final   MCH 05/22/2021 29.0  26.0 - 34.0 pg Final   MCHC 05/22/2021 33.7  30.0 - 36.0 g/dL Final   RDW 05/22/2021 11.9  11.5 - 15.5 % Final   Platelets 05/22/2021 367  150 - 400 K/uL Final   nRBC 05/22/2021 0.0  0.0 - 0.2 % Final   Neutrophils Relative % 05/22/2021 37  % Final   Neutro Abs 05/22/2021 2.6  1.7 - 7.7 K/uL Final   Lymphocytes Relative 05/22/2021 41  % Final   Lymphs Abs 05/22/2021 2.9  0.7 - 4.0 K/uL Final   Monocytes Relative 05/22/2021 13  % Final   Monocytes Absolute 05/22/2021  0.9  0.1 - 1.0 K/uL Final   Eosinophils Relative 05/22/2021 8  %  Final   Eosinophils Absolute 05/22/2021 0.6 (A) 0.0 - 0.5 K/uL Final   Basophils Relative 05/22/2021 1  % Final   Basophils Absolute 05/22/2021 0.0  0.0 - 0.1 K/uL Final   Immature Granulocytes 05/22/2021 0  % Final   Abs Immature Granulocytes 05/22/2021 0.02  0.00 - 0.07 K/uL Final   Performed at [facility], [place] [address], [place], [place] [address]   Sodium 05/22/2021 137  135 - 145 mmol/L Final   Potassium 05/22/2021 4.3  3.5 - 5.1 mmol/L Final   Chloride 05/22/2021 100  98 - 111 mmol/L Final   CO2 05/22/2021 26  22 - 32 mmol/L Final   Glucose, Bld 05/22/2021 77  70 - 99 mg/dL Final   Glucose reference range applies only to samples taken after fasting for at least 8 hours.   BUN 05/22/2021 15  6 - 20 mg/dL Final   Creatinine, Ser 05/22/2021 0.65  0.61 - 1.24 mg/dL Final   Calcium 05/22/2021 9.2  8.9 - 10.3 mg/dL Final   Total Protein 05/22/2021 7.4  6.5 - 8.1 g/dL Final   POST-ULTRACENTRIFUGATION   Albumin 05/22/2021 3.6  3.5 - 5.0 g/dL Final   AST 05/22/2021 94 (A) 15 - 41 U/L Final   ALT 05/22/2021 108 (A) 0 - 44 U/L Final   Alkaline Phosphatase 05/22/2021 92  38 - 126 U/L Final   Total Bilirubin 05/22/2021 0.4  0.3 - 1.2 mg/dL Final   GFR, Estimated 05/22/2021 >60  >60 mL/min Final   Comment: (NOTE) Calculated using the CKD-EPI Creatinine Equation (2021)    Anion gap 05/22/2021 11  5 - 15 Final   Performed at [place] [address] [place], [place] [address]   Hgb A1c MFr Bld 05/22/2021 5.4  4.8 - 5.6 % Final   Comment: (NOTE) Pre diabetes:          5.7%-6.4%  Diabetes:              >6.4%  Glycemic control for   <7.0% adults with diabetes    Mean Plasma Glucose 05/22/2021 108.28  mg/dL Final   Performed at [facility], [place] [address] [place], [place] [address]   Magnesium 05/22/2021 2.3  1.7 - 2.4 mg/dL Final   Performed at [place] [address],  [place], [place] [address]   Alcohol, Ethyl (B) 05/22/2021 <10  <10 mg/dL Final   Comment: (NOTE) Lowest detectable limit for serum alcohol is 10 mg/dL.  For medical purposes only. Performed at [facility], [place] [address] [place], [place] [address]    TSH 05/22/2021 0.405  0.350 - 4.500 uIU/mL Final   Comment: Performed by a 3rd Generation assay with a functional sensitivity of <=0.01 uIU/mL. Performed at [facility], [place] [address] [place], [place] [address]    Color, Urine 05/22/2021 YELLOW  YELLOW Final   APPearance 05/22/2021 HAZY (A) CLEAR Final   Specific Gravity, Urine 05/22/2021 1.031 (A) 1.005 - 1.030 Final   pH 05/22/2021 5.0  5.0 - 8.0 Final   Glucose, UA 05/22/2021 NEGATIVE  NEGATIVE mg/dL Final   Hgb urine dipstick 05/22/2021 NEGATIVE  NEGATIVE Final   Bilirubin Urine 05/22/2021 NEGATIVE  NEGATIVE Final   Ketones, ur 05/22/2021 NEGATIVE  NEGATIVE mg/dL Final   Protein, ur 05/22/2021 NEGATIVE  NEGATIVE mg/dL Final   Nitrite 05/22/2021 NEGATIVE  NEGATIVE Final   Leukocytes,Ua 05/22/2021 NEGATIVE  NEGATIVE Final   RBC / HPF  05/22/2021 0-5  0 - 5 RBC/hpf Final   WBC, UA 05/22/2021 0-5  0 - 5 WBC/hpf Final   Bacteria, UA 05/22/2021 RARE (A) NONE SEEN Final   Squamous Epithelial / LPF 05/22/2021 0-5  0 - 5 Final   Mucus 05/22/2021 PRESENT   Final   Hyaline Casts, UA 05/22/2021 PRESENT   Final   Performed at [facility], [place] [address], [place], [place] [address]   POC Amphetamine UR 05/22/2021 Positive (A) NONE DETECTED (Cut Off Level 1000 ng/mL) Final   POC Secobarbital (BAR) 05/22/2021 None Detected  NONE DETECTED (Cut Off Level 300 ng/mL) Final   POC Buprenorphine (BUP) 05/22/2021 None Detected  NONE DETECTED (Cut Off Level 10 ng/mL) Final   POC Oxazepam (BZO) 05/22/2021 Positive (A) NONE DETECTED (Cut Off Level 300 ng/mL) Final   POC Cocaine UR 05/22/2021 None Detected  NONE DETECTED (Cut Off Level 300 ng/mL) Final   POC Methamphetamine UR 05/22/2021  Positive (A) NONE DETECTED (Cut Off Level 1000 ng/mL) Final   POC Morphine 05/22/2021 Positive (A) NONE DETECTED (Cut Off Level 300 ng/mL) Final   POC Oxycodone UR 05/22/2021 None Detected  NONE DETECTED (Cut Off Level 100 ng/mL) Final   POC Methadone UR 05/22/2021 None Detected  NONE DETECTED (Cut Off Level 300 ng/mL) Final   POC Marijuana UR 05/22/2021 Positive (A) NONE DETECTED (Cut Off Level 50 ng/mL) Final   RPR Ser Ql 05/22/2021 NON REACTIVE  NON REACTIVE Final   Performed at [facility], [place] [address], [place], [place] [address]   HIV Screen 4th Generation wRfx 05/22/2021 Non Reactive  Non Reactive Final   Performed at [facility], [place] [address], [place], [place] [address]   SARS Coronavirus 2 by RT PCR 05/22/2021 NEGATIVE  NEGATIVE Final   Comment: (NOTE) SARS-CoV-2 target nucleic acids are NOT DETECTED.  The SARS-CoV-2 RNA is generally detectable in upper respiratory specimens during the acute phase of infection. The lowest concentration of SARS-CoV-2 viral copies this assay can detect is 138 copies/mL. A negative result does not preclude SARS-Cov-2 infection and should not be used as the sole basis for treatment or other patient management decisions. A negative result may occur with  improper specimen collection/handling, submission of specimen other than nasopharyngeal swab, presence of viral mutation(s) within the areas targeted by this assay, and inadequate number of viral copies(<138 copies/mL). A negative result must be combined with clinical observations, patient history, and epidemiological information. The expected result is Negative.  Fact Sheet for Patients:  EntrepreneurPulse.com.au  Fact Sheet for Healthcare Providers:  IncredibleEmployment.be  This test is no                          t yet approved or cleared by the [place] FDA and  has been authorized for detection and/or diagnosis of SARS-CoV-2 by FDA  under an Emergency Use Authorization (EUA). This EUA will remain  in effect (meaning this test can be used) for the duration of the COVID-19 declaration under Section 564(b)(1) of the Act, 21 U.S.C.section 360bbb-3(b)(1), unless the authorization is terminated  or revoked sooner.       Influenza A by PCR 05/22/2021 NEGATIVE  NEGATIVE Final   Influenza B by PCR 05/22/2021 NEGATIVE  NEGATIVE Final   Comment: (NOTE) The Xpert Xpress SARS-CoV-2/FLU/RSV plus assay is intended as an aid in the diagnosis of influenza from Nasopharyngeal swab specimens and should not be used as a  sole basis for treatment. Nasal washings and aspirates are unacceptable for Xpert Xpress SARS-CoV-2/FLU/RSV testing.  Fact Sheet for Patients: EntrepreneurPulse.com.au  Fact Sheet for Healthcare Providers: IncredibleEmployment.be  This test is not yet approved or cleared by the [place] FDA and has been authorized for detection and/or diagnosis of SARS-CoV-2 by FDA under an Emergency Use Authorization (EUA). This EUA will remain in effect (meaning this test can be used) for the duration of the COVID-19 declaration under Section 564(b)(1) of the Act, 21 U.S.C. section 360bbb-3(b)(1), unless the authorization is terminated or revoked.  Performed at [facility], [place] [address], [place], [place] [address]    Cholesterol 05/22/2021 151  0 - 200 mg/dL Final   LIPEMIC SPECIMEN   Triglycerides 05/22/2021 88  <150 mg/dL Final   HDL 05/22/2021 77  >40 mg/dL Final   Total CHOL/HDL Ratio 05/22/2021 2.0  RATIO Final   VLDL 05/22/2021 18  0 - 40 mg/dL Final   LDL Cholesterol 05/22/2021 56  0 - 99 mg/dL Final   Comment:        Total Cholesterol/HDL:CHD Risk Coronary Heart Disease Risk Table                     Men   Women  1/2 Average Risk   3.4   3.3  Average Risk       5.0   4.4  2 X Average Risk   9.6   7.1  3 X Average Risk  23.4   11.0        Use the calculated  Patient Ratio above and the CHD Risk Table to determine the patient's CHD Risk.        ATP III CLASSIFICATION (LDL):  <100     mg/dL   Optimal  100-129  mg/dL   Near or Above                    Optimal  130-159  mg/dL   Borderline  160-189  mg/dL   High  >190     mg/dL   Very High Performed at [place] [address], [place],  [address]   Hospital Outpatient Visit on 05/22/2021  Component Date Value Ref Range Status   SARS Coronavirus 2 by RT PCR 05/22/2021 NEGATIVE  NEGATIVE Final   Comment: (NOTE) SARS-CoV-2 target nucleic acids are NOT DETECTED.  The SARS-CoV-2 RNA is generally detectable in upper respiratory specimens during the acute phase of infection. The lowest concentration of SARS-CoV-2 viral copies this assay can detect is 138 copies/mL. A negative result does not preclude SARS-Cov-2 infection and should not be used as the sole basis for treatment or other patient management decisions. A negative result may occur with  improper specimen collection/handling, submission of specimen other than nasopharyngeal swab, presence of viral mutation(s) within the areas targeted by this assay, and inadequate number of viral copies(<138 copies/mL). A negative result must be combined with clinical observations, patient history, and epidemiological information. The expected result is Negative.  Fact Sheet for Patients:  EntrepreneurPulse.com.au  Fact Sheet for Healthcare Providers:  IncredibleEmployment.be  This test is no                          t yet approved or cleared by the [place] FDA and  has been authorized for detection and/or diagnosis of SARS-CoV-2 by FDA under an Emergency Use Authorization (EUA). This EUA will  remain  in effect (meaning this test can be used) for the duration of the COVID-19 declaration under Section 564(b)(1) of the Act, 21 U.S.C.section 360bbb-3(b)(1), unless the authorization is  terminated  or revoked sooner.       Influenza A by PCR 05/22/2021 NEGATIVE  NEGATIVE Final   Influenza B by PCR 05/22/2021 NEGATIVE  NEGATIVE Final   Comment: (NOTE) The Xpert Xpress SARS-CoV-2/FLU/RSV plus assay is intended as an aid in the diagnosis of influenza from Nasopharyngeal swab specimens and should not be used as a sole basis for treatment. Nasal washings and aspirates are unacceptable for Xpert Xpress SARS-CoV-2/FLU/RSV testing.  Fact Sheet for Patients: EntrepreneurPulse.com.au  Fact Sheet for Healthcare Providers: IncredibleEmployment.be  This test is not yet approved or cleared by the [place] FDA and has been authorized for detection and/or diagnosis of SARS-CoV-2 by FDA under an Emergency Use Authorization (EUA). This EUA will remain in effect (meaning this test can be used) for the duration of the COVID-19 declaration under Section 564(b)(1) of the Act, 21 U.S.C. section 360bbb-3(b)(1), unless the authorization is terminated or revoked.  Performed at [facility], [place] [address] [place], [place] [address]    SARSCOV2ONAVIRUS 2 AG 05/22/2021 NEGATIVE  NEGATIVE Final   Comment: (NOTE) SARS-CoV-2 antigen NOT DETECTED.   Negative results are presumptive.  Negative results do not preclude SARS-CoV-2 infection and should not be used as the sole basis for treatment or other patient management decisions, including infection  control decisions, particularly in the presence of clinical signs and  symptoms consistent with COVID-19, or in those who have been in contact with the virus.  Negative results must be combined with clinical observations, patient history, and epidemiological information. The expected result is Negative.  Fact Sheet for Patients: HandmadeRecipes.com.cy  Fact Sheet for Healthcare Providers: FuneralLife.at  This test is not yet  approved or cleared by the [place] FDA and  has been authorized for detection and/or diagnosis of SARS-CoV-2 by FDA under an Emergency Use Authorization (EUA).  This EUA will remain in effect (meaning this test can be used) for the duration of  the COV                          ID-19 declaration under Section 564(b)(1) of the Act, 21 U.S.C. section 360bbb-3(b)(1), unless the authorization is terminated or revoked sooner.    Admission on 05/14/2021, Discharged on 05/15/2021  Component Date Value Ref Range Status   WBC 05/14/2021 14.5 (A) 4.0 - 10.5 K/uL Final   RBC 05/14/2021 4.91  4.22 - 5.81 MIL/uL Final   Hemoglobin 05/14/2021 14.3  13.0 - 17.0 g/dL Final   HCT 05/14/2021 41.0  39.0 - 52.0 % Final   MCV 05/14/2021 83.5  80.0 - 100.0 fL Final   MCH 05/14/2021 29.1  26.0 - 34.0 pg Final   MCHC 05/14/2021 34.9  30.0 - 36.0 g/dL Final   RDW 05/14/2021 11.6  11.5 - 15.5 % Final   Platelets 05/14/2021 382  150 - 400 K/uL Final   nRBC 05/14/2021 0.0  0.0 - 0.2 % Final   Neutrophils Relative % 05/14/2021 61  % Final   Neutro Abs 05/14/2021 8.9 (A) 1.7 - 7.7 K/uL Final   Lymphocytes Relative 05/14/2021 29  % Final   Lymphs Abs 05/14/2021 4.1 (A) 0.7 - 4.0 K/uL Final   Monocytes Relative 05/14/2021 9  % Final   Monocytes Absolute 05/14/2021 1.2 (A) 0.1 - 1.0  K/uL Final   Eosinophils Relative 05/14/2021 0  % Final   Eosinophils Absolute 05/14/2021 0.1  0.0 - 0.5 K/uL Final   Basophils Relative 05/14/2021 1  % Final   Basophils Absolute 05/14/2021 0.1  0.0 - 0.1 K/uL Final   Immature Granulocytes 05/14/2021 0  % Final   Abs Immature Granulocytes 05/14/2021 0.04  0.00 - 0.07 K/uL Final   Performed at [facility], [place] [address], [place], [place] [address]   Opiates 05/14/2021 NONE DETECTED  NONE DETECTED Final   Cocaine 05/14/2021 NONE DETECTED  NONE DETECTED Final   Benzodiazepines 05/14/2021 NONE DETECTED  NONE DETECTED Final   Amphetamines 05/14/2021 POSITIVE  (A) NONE DETECTED Final   Tetrahydrocannabinol 05/14/2021 NONE DETECTED  NONE DETECTED Final   Barbiturates 05/14/2021 NONE DETECTED  NONE DETECTED Final   Comment: (NOTE) DRUG SCREEN FOR MEDICAL PURPOSES ONLY.  IF CONFIRMATION IS NEEDED FOR ANY PURPOSE, NOTIFY LAB WITHIN 5 DAYS.  LOWEST DETECTABLE LIMITS FOR URINE DRUG SCREEN Drug Class                     Cutoff (ng/mL) Amphetamine and metabolites    1000 Barbiturate and metabolites    200 Benzodiazepine                 [id number] Tricyclics and metabolites     300 Opiates and metabolites        300 Cocaine and metabolites        300 THC                            50 Performed at [facility], [place] [address], [place], [place] [address]    Alcohol, Ethyl (B) 05/14/2021 <10  <10 mg/dL Final   Comment: (NOTE) Lowest detectable limit for serum alcohol is 10 mg/dL.  For medical purposes only. Performed at [facility], [place] [address], [place], [place] [address]    Sodium 05/14/2021 135  135 - 145 mmol/L Final   Potassium 05/14/2021 3.8  3.5 - 5.1 mmol/L Final   Chloride 05/14/2021 105  98 - 111 mmol/L Final   CO2 05/14/2021 23  22 - 32 mmol/L Final   Glucose, Bld 05/14/2021 118 (A) 70 - 99 mg/dL Final   Glucose reference range applies only to samples taken after fasting for at least 8 hours.   BUN 05/14/2021 21 (A) 6 - 20 mg/dL Final   Creatinine, Ser 05/14/2021 0.79  0.61 - 1.24 mg/dL Final   Calcium 05/14/2021 10.0  8.9 - 10.3 mg/dL Final   Total Protein 05/14/2021 9.0 (A) 6.5 - 8.1 g/dL Final   Albumin 05/14/2021 4.2  3.5 - 5.0 g/dL Final   AST 05/14/2021 114 (A) 15 - 41 U/L Final   ALT 05/14/2021 106 (A) 0 - 44 U/L Final   Alkaline Phosphatase 05/14/2021 91  38 - 126 U/L Final   Total Bilirubin 05/14/2021 1.0  0.3 - 1.2 mg/dL Final   GFR, Estimated 05/14/2021 >60  >60 mL/min Final   Comment: (NOTE) Calculated using the CKD-EPI Creatinine Equation (2021)    Anion gap 05/14/2021 7  5 - 15  Final   Performed at [facility], [place] [address], [place], [place] [address]   SARS Coronavirus 2 by RT PCR 05/14/2021 NEGATIVE  NEGATIVE Final   Comment: (NOTE) SARS-CoV-2 target nucleic acids are NOT DETECTED.  The SARS-CoV-2 RNA is generally detectable  in upper respiratory specimens during the acute phase of infection. The lowest concentration of SARS-CoV-2 viral copies this assay can detect is 138 copies/mL. A negative result does not preclude SARS-Cov-2 infection and should not be used as the sole basis for treatment or other patient management decisions. A negative result may occur with  improper specimen collection/handling, submission of specimen other than nasopharyngeal swab, presence of viral mutation(s) within the areas targeted by this assay, and inadequate number of viral copies(<138 copies/mL). A negative result must be combined with clinical observations, patient history, and epidemiological information. The expected result is Negative.  Fact Sheet for Patients:  EntrepreneurPulse.com.au  Fact Sheet for Healthcare Providers:  IncredibleEmployment.be  This test is no                          t yet approved or cleared by the [place] FDA and  has been authorized for detection and/or diagnosis of SARS-CoV-2 by FDA under an Emergency Use Authorization (EUA). This EUA will remain  in effect (meaning this test can be used) for the duration of the COVID-19 declaration under Section 564(b)(1) of the Act, 21 U.S.C.section 360bbb-3(b)(1), unless the authorization is terminated  or revoked sooner.       Influenza A by PCR 05/14/2021 NEGATIVE  NEGATIVE Final   Influenza B by PCR 05/14/2021 NEGATIVE  NEGATIVE Final   Comment: (NOTE) The Xpert Xpress SARS-CoV-2/FLU/RSV plus assay is intended as an aid in the diagnosis of influenza from Nasopharyngeal swab specimens and should not be used as a sole basis for  treatment. Nasal washings and aspirates are unacceptable for Xpert Xpress SARS-CoV-2/FLU/RSV testing.  Fact Sheet for Patients: EntrepreneurPulse.com.au  Fact Sheet for Healthcare Providers: IncredibleEmployment.be  This test is not yet approved or cleared by the [place] FDA and has been authorized for detection and/or diagnosis of SARS-CoV-2 by FDA under an Emergency Use Authorization (EUA). This EUA will remain in effect (meaning this test can be used) for the duration of the COVID-19 declaration under Section 564(b)(1) of the Act, 21 U.S.C. section 360bbb-3(b)(1), unless the authorization is terminated or revoked.  Performed at [facility], [place] [address], [place],  [address]   Admission on 03/03/2021, Discharged on 03/04/2021  Component Date Value Ref Range Status   Sodium 03/04/2021 137  135 - 145 mmol/L Final   Potassium 03/04/2021 4.0  3.5 - 5.1 mmol/L Final   Chloride 03/04/2021 100  98 - 111 mmol/L Final   CO2 03/04/2021 26  22 - 32 mmol/L Final   Glucose, Bld 03/04/2021 104 (A) 70 - 99 mg/dL Final   Glucose reference range applies only to samples taken after fasting for at least 8 hours.   BUN 03/04/2021 15  6 - 20 mg/dL Final   Creatinine, Ser 03/04/2021 0.93  0.61 - 1.24 mg/dL Final   Calcium 03/04/2021 10.4 (A) 8.9 - 10.3 mg/dL Final   Total Protein 03/04/2021 9.4 (A) 6.5 - 8.1 g/dL Final   Albumin 03/04/2021 4.4  3.5 - 5.0 g/dL Final   AST 03/04/2021 65 (A) 15 - 41 U/L Final   ALT 03/04/2021 97 (A) 0 - 44 U/L Final   Alkaline Phosphatase 03/04/2021 85  38 - 126 U/L Final   Total Bilirubin 03/04/2021 1.0  0.3 - 1.2 mg/dL Final   GFR, Estimated 03/04/2021 >60  >60 mL/min Final   Comment: (NOTE) Calculated using the CKD-EPI Creatinine Equation (2021)    Anion gap 03/04/2021  11  5 - 15 Final   Performed at [facility], [place] [address], [place], [place] [address]   Alcohol, Ethyl (B)  03/04/2021 <10  <10 mg/dL Final   Comment: (NOTE) Lowest detectable limit for serum alcohol is 10 mg/dL.  For medical purposes only. Performed at [facility], [place] [address] [place], [place] [address]    WBC 03/04/2021 12.6 (A) 4.0 - 10.5 K/uL Final   RBC 03/04/2021 5.16  4.22 - 5.81 MIL/uL Final   Hemoglobin 03/04/2021 15.3  13.0 - 17.0 g/dL Final   HCT 03/04/2021 46.1  39.0 - 52.0 % Final   MCV 03/04/2021 89.3  80.0 - 100.0 fL Final   MCH 03/04/2021 29.7  26.0 - 34.0 pg Final   MCHC 03/04/2021 33.2  30.0 - 36.0 g/dL Final   RDW 03/04/2021 11.9  11.5 - 15.5 % Final   Platelets 03/04/2021 417 (A) 150 - 400 K/uL Final   nRBC 03/04/2021 0.0  0.0 - 0.2 % Final   Performed at [facility] [address], [place], [place] [address]   Opiates 03/03/2021 POSITIVE (A) NONE DETECTED Final   Cocaine 03/03/2021 NONE DETECTED  NONE DETECTED Final   Benzodiazepines 03/03/2021 NONE DETECTED  NONE DETECTED Final   Amphetamines 03/03/2021 POSITIVE (A) NONE DETECTED Final   Tetrahydrocannabinol 03/03/2021 NONE DETECTED  NONE DETECTED Final   Barbiturates 03/03/2021 NONE DETECTED  NONE DETECTED Final   Comment: (NOTE) DRUG SCREEN FOR MEDICAL PURPOSES ONLY.  IF CONFIRMATION IS NEEDED FOR ANY PURPOSE, NOTIFY LAB WITHIN 5 DAYS.  LOWEST DETECTABLE LIMITS FOR URINE DRUG SCREEN Drug Class                     Cutoff (ng/mL) Amphetamine and metabolites    1000 Barbiturate and metabolites    200 Benzodiazepine                 [id number] Tricyclics and metabolites     300 Opiates and metabolites        300 Cocaine and metabolites        300 THC                            50 Performed at [facility], [place] [address], [place], [place] [address]   Admission on 02/20/2021, Discharged on 02/20/2021  Component Date Value Ref Range Status   SARS Coronavirus 2 by RT PCR 02/20/2021 NEGATIVE  NEGATIVE Final   Comment: (NOTE) SARS-CoV-2 target nucleic acids are NOT DETECTED.  The SARS-CoV-2  RNA is generally detectable in upper respiratory specimens during the acute phase of infection. The lowest concentration of SARS-CoV-2 viral copies this assay can detect is 138 copies/mL. A negative result does not preclude SARS-Cov-2 infection and should not be used as the sole basis for treatment or other patient management decisions. A negative result may occur with  improper specimen collection/handling, submission of specimen other than nasopharyngeal swab, presence of viral mutation(s) within the areas targeted by this assay, and inadequate number of viral copies(<138 copies/mL). A negative result must be combined with clinical observations, patient history, and epidemiological information. The expected result is Negative.  Fact Sheet for Patients:  EntrepreneurPulse.com.au  Fact Sheet for Healthcare Providers:  IncredibleEmployment.be  This test is no  t yet approved or cleared by the [place] and  has been authorized for detection and/or diagnosis of SARS-CoV-2 by FDA under an Emergency Use Authorization (EUA). This EUA will remain  in effect (meaning this test can be used) for the duration of the COVID-19 declaration under Section 564(b)(1) of the Act, 21 U.S.C.section 360bbb-3(b)(1), unless the authorization is terminated  or revoked sooner.       Influenza A by PCR 02/20/2021 NEGATIVE  NEGATIVE Final   Influenza B by PCR 02/20/2021 NEGATIVE  NEGATIVE Final   Comment: (NOTE) The Xpert Xpress SARS-CoV-2/FLU/RSV plus assay is intended as an aid in the diagnosis of influenza from Nasopharyngeal swab specimens and should not be used as a sole basis for treatment. Nasal washings and aspirates are unacceptable for Xpert Xpress SARS-CoV-2/FLU/RSV testing.  Fact Sheet for Patients: EntrepreneurPulse.com.au  Fact Sheet for Healthcare  Providers: IncredibleEmployment.be  This test is not yet approved or cleared by the [place] FDA and has been authorized for detection and/or diagnosis of SARS-CoV-2 by FDA under an Emergency Use Authorization (EUA). This EUA will remain in effect (meaning this test can be used) for the duration of the COVID-19 declaration under Section 564(b)(1) of the Act, 21 U.S.C. section 360bbb-3(b)(1), unless the authorization is terminated or revoked.  Performed at [facility], [place] [address], [place], [place] [address]    Sodium 02/20/2021 137  135 - 145 mmol/L Final   Potassium 02/20/2021 3.4 (A) 3.5 - 5.1 mmol/L Final   Chloride 02/20/2021 101  98 - 111 mmol/L Final   CO2 02/20/2021 28  22 - 32 mmol/L Final   Glucose, Bld 02/20/2021 108 (A) 70 - 99 mg/dL Final   Glucose reference range applies only to samples taken after fasting for at least 8 hours.   BUN 02/20/2021 5 (A) 6 - 20 mg/dL Final   Creatinine, Ser 02/20/2021 0.74  0.61 - 1.24 mg/dL Final   Calcium 02/20/2021 9.2  8.9 - 10.3 mg/dL Final   GFR, Estimated 02/20/2021 >60  >60 mL/min Final   Comment: (NOTE) Calculated using the CKD-EPI Creatinine Equation (2021)    Anion gap 02/20/2021 8  5 - 15 Final   Performed at [facility], [place] [address], [place], [place] [address]   WBC 02/20/2021 7.6  4.0 - 10.5 K/uL Final   RBC 02/20/2021 4.39  4.22 - 5.81 MIL/uL Final   Hemoglobin 02/20/2021 13.1  13.0 - 17.0 g/dL Final   HCT 02/20/2021 40.4  39.0 - 52.0 % Final   MCV 02/20/2021 92.0  80.0 - 100.0 fL Final   MCH 02/20/2021 29.8  26.0 - 34.0 pg Final   MCHC 02/20/2021 32.4  30.0 - 36.0 g/dL Final   RDW 02/20/2021 12.0  11.5 - 15.5 % Final   Platelets 02/20/2021 400  150 - 400 K/uL Final   nRBC 02/20/2021 0.0  0.0 - 0.2 % Final   Neutrophils Relative % 02/20/2021 72  % Final   Neutro Abs 02/20/2021 5.4  1.7 - 7.7 K/uL Final   Lymphocytes Relative 02/20/2021 21  % Final   Lymphs Abs 02/20/2021 1.6   0.7 - 4.0 K/uL Final   Monocytes Relative 02/20/2021 6  % Final   Monocytes Absolute 02/20/2021 0.5  0.1 - 1.0 K/uL Final   Eosinophils Relative 02/20/2021 0  % Final   Eosinophils Absolute 02/20/2021 0.0  0.0 - 0.5 K/uL Final   Basophils Relative 02/20/2021 1  % Final   Basophils Absolute  02/20/2021 0.1  0.0 - 0.1 K/uL Final   Immature Granulocytes 02/20/2021 0  % Final   Abs Immature Granulocytes 02/20/2021 0.02  0.00 - 0.07 K/uL Final   Performed at [facility] [address], [place], [place] [address]    Blood Alcohol level:  Lab Results  Component Value Date   ETH <10 05/22/2021   ETH <10 [id number]    Metabolic Disorder Labs: Lab Results  Component Value Date   HGBA1C 5.4 05/22/2021   MPG 108.28 05/22/2021   No results found for: PROLACTIN Lab Results  Component Value Date   CHOL 151 05/22/2021   TRIG 88 05/22/2021   HDL 77 05/22/2021   CHOLHDL 2.0 05/22/2021   VLDL 18 05/22/2021   LDLCALC 56 05/22/2021    Therapeutic Lab Levels: No results found for: LITHIUM No results found for: VALPROATE No components found for:  CBMZ  Physical Findings   PHQ2-9    Flowsheet Row OP Visit from 05/22/2021 in [facility]  PHQ-2 Total Score 3  PHQ-9 Total Score 13      Flowsheet Row ED from 05/22/2021 in [facility] Most recent reading at 05/22/2021 11:00 PM OP Visit from 05/22/2021 in [facility] Most recent reading at 05/22/2021  4:40 PM ED from 05/21/2021 in [facility] Most recent reading at 05/21/2021  9:54 PM  C-SSRS RISK CATEGORY High Risk High Risk No Risk        Musculoskeletal  Strength & Muscle Tone: within normal limits Gait & Station:  unable to assess; patient laying in bed during assessment Patient leans:  unable to assess; patient laying in bed during assessment  Psychiatric Specialty Exam  Presentation  General Appearance:  Appropriate for Environment; Disheveled  Eye Contact:Other (comment); Poor (limited. eyes closed the majority of interview)  Speech:Clear and Coherent; Normal Rate  Speech Volume:Decreased  Handedness:Right   Mood and Affect  Mood:Anxious; Depressed; Dysphoric  Affect:Congruent   Thought Process  Thought Processes:Coherent; Goal Directed; Linear  Descriptions of Associations:Intact  Orientation:Full (Time, Place and Person)  Thought Content:WDL  Diagnosis of Schizophrenia or Schizoaffective disorder in past: No    Hallucinations:Hallucinations: None  Ideas of Reference:None  Suicidal Thoughts:Suicidal Thoughts: Yes, Active   Homicidal Thoughts:Homicidal Thoughts: No   Sensorium  Memory:Immediate Good; Recent Good; Remote Fair  Judgment:Intact  Insight:Present   Executive Functions  Concentration:Fair  Attention Span:Fair  [place]   Psychomotor Activity  Psychomotor Activity:Psychomotor Activity: Normal   Assets  Assets:Communication Skills; Desire for Improvement; Physical Health   Sleep  Sleep:Sleep: Poor   Nutritional Assessment (For OBS and FBC admissions only) Has the patient had a weight loss or gain of 10 pounds or more in the last 3 months?: No Has the patient had a decrease in food intake/or appetite?: No Does the patient have dental problems?: No Does the patient have eating habits or behaviors that may be indicators of an eating disorder including binging or inducing vomiting?: No Has the patient recently lost weight without trying?: 0 Has the patient been eating poorly because of a decreased appetite?: 0 Malnutrition Screening Tool Score: 0   Physical Exam  Physical Exam Constitutional:      Appearance: He is normal weight.     Comments: Moderate distress, appears to be in active opioid withdrawal  HENT:     Head: Normocephalic and atraumatic.  Eyes:     Extraocular Movements:  Extraocular movements intact.  Cardiovascular:     Rate and Rhythm: Normal rate and regular rhythm.     Heart sounds: Normal heart sounds.  Pulmonary:     Effort: Pulmonary effort is normal.  Abdominal:     General: Abdomen is flat. Bowel sounds are normal.  Neurological:     General: No focal deficit present.    Review of Systems  Constitutional:  Positive for chills, diaphoresis, fever and malaise/fatigue.       +subjective fever   HENT:  Negative for hearing loss.   Eyes:  Negative for discharge and redness.  Respiratory:  Negative for cough.   Cardiovascular:  Negative for chest pain.  Gastrointestinal:  Positive for nausea. Negative for abdominal pain and vomiting.  Musculoskeletal:  Negative for myalgias.  Neurological:  Negative for headaches.  Psychiatric/Behavioral:  Positive for depression, substance abuse and suicidal ideas. Negative for hallucinations. The patient is nervous/anxious and has insomnia.    Blood pressure 116/73, pulse 77, temperature 99 F (37.2 C), temperature source Oral, resp. rate 16, SpO2 100 %. There is no height or weight on file to calculate BMI.  Treatment Plan Summary: 30 yo male with a history of substance who presented to [facility] on 9/13 requesting detox from opioids and reported SI with a plan to hang himself. Patient as accepted to [facility] 9/13 for continued treatment. Labs reviewed-CBC, lipid panel unremarkable. TSH wnl (0.405), RPR nonreactive, a1c wnl (5.4). CBC with mildly elevated AST and ALT at 94 and 108 respectively. UDS+ amphetamine, methamphetamine, oxazepam, morphine, and marijuana. GC/chlamydia in process. Patient as started on clonidine taper by overnight provider. VSS. This morning, patient report sx c/w opioid withdrawal and is somewhat irritable and unable to participate in full interview today. Will obtain additional information tomorrow in regards to mood symptoms, psychiatric treatment and additional  history. Will continue symptomatic  treatment of opioid withdrawal at this time and order hepatitis panel given IV drug use.    Polysubstance abuse -Patient expresses interest in substance use treatment-SW assisting with possible rehab placement  Opioid abuse with withdrawal -ordered acute hepatitis panel due to IV drug use-will follow up.  Opiate Withdrawal Protocol: -continue Clonidine taper as started by previous provider. -Bentyl '10mg'$  PO q6hr PRN for abdominal muscle cramps -Loperamide '2mg'$  PO q6hr PRN for diarrhea -Robaxin '750mg'$  PO q8hr PRN for muscle spasm -Zofran '4mg'$  SL q8hr PRN for nausea -Vistaril '25mg'$  PO q8hr PRN for anxiety  Ival Bible, MD 05/23/2021 1:35 PM

## 2021-05-23 NOTE — ED Notes (Addendum)
Notified Calvin Romp, NP that per [facility], pt did not receive scheduled doses of Clonidine at 1915 or 2200 at [facility]. NP advised to give pt one dose of Clonidine now.

## 2021-05-23 NOTE — Progress Notes (Signed)
Pt is asleep. Respirations are even and unlabored. Pt's COWS was 11. Moderate opiate withdrawals symptoms exhibited. Scheduled Clonidine administered. Will continue to monitor.

## 2021-05-23 NOTE — Progress Notes (Signed)
Attempted twice to draw hepatitis panel however unable to draw blood.  Dr. Serafina Mitchell aware and staff will re-attempt later this evening or tomorrow.

## 2021-05-23 NOTE — Progress Notes (Signed)
Pt is awake, alert and oriented. Pt is currently eating breakfast in the dinning room. Pt's safety is maintained.

## 2021-05-23 NOTE — Group Note (Signed)
Group Topic: Social Support  Group Date: 05/23/2021 Start Time: [place] End Time: 1149 Facilitators: Cherlynn Perches P  Department: [facility]  Number of Participants: 2  Group Focus: goals/reality orientation Treatment Modality:  Leisure Development Interventions utilized were assignment, group exercise, and leisure development Purpose: reinforce self-care  Name: Calvin Sanchez Date of Birth: 03/24/1991  MR: [id number]    Level of Participation: did not attend Quality of Participation: did not attend Interactions with others: did not attend Mood/Affect: did not attend Triggers (if applicable): did not attend Cognition: did not attend Progress: Other Response: did not attend Plan: follow-up needed  Patients Problems:  Patient Active Problem List   Diagnosis Date Noted   Homelessness 03/26/2021   Substance induced mood disorder ([place]) 03/26/2021   Passive suicidal ideations 03/26/2021   Cellulitis of arm 12/29/2019   Polysubstance abuse ([place]) 12/29/2019   IVDU (intravenous drug user) 12/29/2019   Sepsis ([place]) 12/29/2019   Leukocytosis 12/29/2019   Thrombocytosis 12/29/2019   Abnormal LFTs 12/29/2019   Hyperglycemia 12/29/2019   Left arm cellulitis 12/29/2019

## 2021-05-24 ENCOUNTER — Encounter (HOSPITAL_COMMUNITY): Payer: Self-pay | Admitting: Family Medicine

## 2021-05-24 DIAGNOSIS — F1520 Other stimulant dependence, uncomplicated: Secondary | ICD-10-CM | POA: Diagnosis not present

## 2021-05-24 DIAGNOSIS — F1190 Opioid use, unspecified, uncomplicated: Secondary | ICD-10-CM | POA: Diagnosis not present

## 2021-05-24 DIAGNOSIS — F1994 Other psychoactive substance use, unspecified with psychoactive substance-induced mood disorder: Secondary | ICD-10-CM | POA: Diagnosis not present

## 2021-05-24 DIAGNOSIS — Z20822 Contact with and (suspected) exposure to covid-19: Secondary | ICD-10-CM | POA: Diagnosis not present

## 2021-05-24 NOTE — ED Notes (Signed)
Pt very squeamish and restless during attempt at obtaining lab draw pt encourage to increase po fluid intake. Pt became very emotional during lab attempt becoming sensitive to touch and whiney.

## 2021-05-24 NOTE — Clinical Social Work Psych Note (Signed)
CSW Update  10:05AM  CSW spoke with [name] to assess for any progress and to further discuss possible discharge plans.   Calvin Sanchez reports that he feels "slightly better" than he did yesterday, however he reports he continues to "feel bad overall". He reports he continues to feel drowsy, and lacks an appetite at this time.   CSW shared with Calvin Sanchez, that he was recently declined for a treatment bed at [facility] due to his Medicaid belonging to [facility]. CSW informed Calvin Sanchez that he was referred to [facility] for review. He was initially agreeable.    1:00PM  Calvin Sanchez appeared to be slightly agitated and walk up to CSW. He blurted out "I want to go. I need my belongings and I am ready to go". CSW inquired if he was still interested in going to treatment and he replied, no I want to leave. CSW informed Calvin Sanchez that he woiuld have to speak with the provider prior to being released. He expressed understanding.   Dr. Serafina Mitchell, MD has been notified.     Radonna Ricker, MSW, LCSW Clinical Education officer, museum ([place]) [facility]

## 2021-05-24 NOTE — ED Notes (Signed)
Pt sleeping at present, no distress noted, A&O x 4, calm & cooperative.  Monitoring for safety.

## 2021-05-24 NOTE — Progress Notes (Signed)
Patient initially spoke with RN and requested to leave however when DR Serafina Mitchell spoke to him he changed his mind and has agreed to stay.  He is now resting in bed without issue .

## 2021-05-24 NOTE — Progress Notes (Signed)
Pt is asleep. Respirations are even and unlabored. Pt stated that he is "not feeling well" during assessment. Pt's COWS was a 7. No acute distress noted. Will continue to monitor.

## 2021-05-24 NOTE — ED Notes (Signed)
Sleeping no distress noted on rounding respiration appear WNL

## 2021-05-24 NOTE — ED Notes (Signed)
Pt is not feeling well, and did not attend the 1000. Group.

## 2021-05-24 NOTE — Progress Notes (Signed)
Pt remains asleep. No signs of acute distress noted. Moderate opiate withdrawal symptoms exhibited. Administered PRN Naproxen and scheduled medication with no incident. Pt endorses SI with plan to hang self. Pt verbally contracts for safety on the unit. Pt denies current HI/AVH. Staff will monitor for pt's safety.

## 2021-05-24 NOTE — Progress Notes (Addendum)
Behavioral Health Progress Note  Date and Time: 05/24/2021 6:28 PM Name: Calvin Sanchez MRN:  [id number]   Original note by MS3 Jackson Latino. I attest that I listened to the student's presentation and reviewed and edited the student note. I further attest that the components of the history of the present illness,  and the assessment and plan documented were performed by the student in the  presence of me (teaching physician). I verified the documentation, personally performed the physical examination and I also verified the medical decision making.   Subjective:  Patient seen and chart reviewed. On presentation, laying in bed and amenable to interview. Denies SI, HI, and AVH this morning. States feeling better than yesterday, but still "down" rating his mood as "2/10." He objectively appears to be in less distress in regards to opioid withdrawal symptoms although does appear drowsy.  Most recent COWS 7.   Pt states that he is sleeping okay, and endorses symptoms of opioid withdrawal including runny nose, stomach pain, nausea, muscle aches/fatigue. States that he has not felt well enough to attend group sessions and has minimal memory of meeting physician and medical student yesterday due to withdrawal symptoms. Denies other physical symptoms.   Pt discussed that he was sober for ~2.5 while at the [facility] facility, and being in a supportive environment was his main motivation in order to remain sober. States that he relapsed in March of this year, due to being in a situation where substances were available, but expresses motivation to attend a treatment facility.   Pt described his main social support as his mother and one friend, and has not yet had contact with them since admission, but expressed a desire to. Notes 1-2 prior attempts of SI when he was about 58-47 years old. States that his mother found him, and had attempted to hang himself. Noted that his father died when he was 74 yo and wanted to  pursue grief counseling at that time but was not able to. Expressed interest in grief counseling presently.  He states that's he was diagnosed as "manic depressive" in the past and was most recently prescribed cymbalta, remeron, and a "mood stabilizer" at [facility] although does not recall the name. By history it appears that patient has not experienced any sx c/w mania/hypomania in the setting of sobriety.   Later in the afternoon MS3 and myself were informed by RN that patient was requesting discharge; however, when I spoke with patient he was agreeable to stay at [facility] in ordered to receive treatment. Informed patient of ADACT referral.   Diagnosis:  Final diagnoses:  Substance induced mood disorder ([name])  Heroin use disorder, severe ([name])  Methamphetamine use disorder, severe ([name])    Total Time spent with patient: 15 minutes   Past Psychiatric History: Previous Medication Trials: yes, previous trials, remeron Previous Psychiatric Hospitalizations: yes Previous Suicide Attempts: yes - at 68-77 years old, about once or twice, mother stopped him  History of Violence: no Outpatient psychiatrist: in past  Social History: Marital Status: single Housing Status:  History of phys/sexual abuse: no   Substance Use (with emphasis over the last 12 months) Recreational Drugs: cocaine Use of Alcohol: yes Tobacco Use:  Rehab History: yes, [facility] H/O Complicated Withdrawal: no  Legal History: Past Charges/Incarcerations: no Pending charges: no    Past Medical History:  Past Medical History:  Diagnosis Date   Homeless    Substance abuse ([place])     Past Surgical History:  Procedure Laterality Date  KNEE SURGERY Left    Family History:  Family History  Problem Relation Age of Onset   Hypertension Mother    Family Psychiatric  History:  Social History:  Social History   Substance and Sexual Activity  Alcohol Use Yes   Comment: Once a week. Last drink: yesterday      Social  History   Substance and Sexual Activity  Drug Use Yes   Types: Cocaine, Methamphetamines   Comment: heroin    Social History   Socioeconomic History   Marital status: Single    Spouse name: Not on file   Number of children: Not on file   Years of education: Not on file   Highest education level: Not on file  Occupational History   Not on file  Tobacco Use   Smoking status: Every Day    Types: Cigars   Smokeless tobacco: Never  Vaping Use   Vaping Use: Never used  Substance and Sexual Activity   Alcohol use: Yes    Comment: Once a week. Last drink: yesterday    Drug use: Yes    Types: Cocaine, Methamphetamines    Comment: heroin   Sexual activity: Not on file  Other Topics Concern   Not on file  Social History Narrative   Not on file   Social Determinants of Health   Financial Resource Strain: Not on file  Food Insecurity: Not on file  Transportation Needs: Not on file  Physical Activity: Not on file  Stress: Not on file  Social Connections: Not on file   SDOH:  SDOH Screenings   Alcohol Screen: Not on file  Depression (PHQ2-9): Medium Risk   PHQ-2 Score: 13  Financial Resource Strain: Not on file  Food Insecurity: Not on file  Housing: Not on file  Physical Activity: Not on file  Social Connections: Not on file  Stress: Not on file  Tobacco Use: High Risk   Smoking Tobacco Use: Every Day   Smokeless Tobacco Use: Never  Transportation Needs: Not on file   Additional Social History:                         Sleep: Fair  Appetite:  Fair  Current Medications:  Current Facility-Administered Medications  Medication Dose Route Frequency Provider Last Rate Last Admin   acetaminophen (TYLENOL) tablet 650 mg  650 mg Oral Q6H PRN Revonda Humphrey, NP       alum & mag hydroxide-simeth (MAALOX/MYLANTA) 200-200-20 MG/5ML suspension 30 mL  30 mL Oral Q4H PRN Revonda Humphrey, NP       cloNIDine (CATAPRES) tablet 0.1 mg  0.1 mg Oral QID Revonda Humphrey, NP   0.1 mg at 05/24/21 1738   Followed by   Derrill Memo ON 05/25/2021] cloNIDine (CATAPRES) tablet 0.1 mg  0.1 mg Oral BH-qamhs Revonda Humphrey, NP       Followed by   Derrill Memo ON 05/27/2021] cloNIDine (CATAPRES) tablet 0.1 mg  0.1 mg Oral QAC breakfast Revonda Humphrey, NP       dicyclomine (BENTYL) tablet 20 mg  20 mg Oral Q6H PRN Revonda Humphrey, NP       hydrOXYzine (ATARAX/VISTARIL) tablet 25 mg  25 mg Oral Q6H PRN Revonda Humphrey, NP       loperamide (IMODIUM) capsule 2-4 mg  2-4 mg Oral PRN Revonda Humphrey, NP       magnesium hydroxide (MILK OF MAGNESIA) suspension 30 mL  30 mL Oral Daily PRN Revonda Humphrey, NP       methocarbamol (ROBAXIN) tablet 750 mg  750 mg Oral Q8H PRN Ival Bible, MD       naproxen (NAPROSYN) tablet 500 mg  500 mg Oral BID PRN Revonda Humphrey, NP   500 mg at 05/24/21 0938   ondansetron (ZOFRAN-ODT) disintegrating tablet 4 mg  4 mg Oral Q6H PRN Revonda Humphrey, NP   4 mg at 05/23/21 1136   traZODone (DESYREL) tablet 50 mg  50 mg Oral QHS PRN Revonda Humphrey, NP       Current Outpatient Medications  Medication Sig Dispense Refill   cloNIDine (CATAPRES) 0.1 MG tablet For opioid withdrawal symptoms: 1 tab po tid x 2 days, then bid x 2 days, then once daily x 2 days (Patient taking differently: Take 0.1 mg by mouth See admin instructions. For opioid withdrawal symptoms: 1 tab po tid x 2 days, then bid x 2 days, then once daily x 2 days) 12 tablet 0   promethazine (PHENERGAN) 25 MG tablet Take 1 tablet (25 mg total) by mouth every 6 (six) hours as needed for nausea or vomiting. 15 tablet 0    Labs  Lab Results:  Admission on 05/22/2021  Component Date Value Ref Range Status   WBC 05/22/2021 6.9  4.0 - 10.5 K/uL Final   RBC 05/22/2021 4.72  4.22 - 5.81 MIL/uL Final   Hemoglobin 05/22/2021 13.7  13.0 - 17.0 g/dL Final   HCT 05/22/2021 40.7  39.0 - 52.0 % Final   MCV 05/22/2021 86.2  80.0 - 100.0 fL Final   MCH 05/22/2021 29.0   26.0 - 34.0 pg Final   MCHC 05/22/2021 33.7  30.0 - 36.0 g/dL Final   RDW 05/22/2021 11.9  11.5 - 15.5 % Final   Platelets 05/22/2021 367  150 - 400 K/uL Final   nRBC 05/22/2021 0.0  0.0 - 0.2 % Final   Neutrophils Relative % 05/22/2021 37  % Final   Neutro Abs 05/22/2021 2.6  1.7 - 7.7 K/uL Final   Lymphocytes Relative 05/22/2021 41  % Final   Lymphs Abs 05/22/2021 2.9  0.7 - 4.0 K/uL Final   Monocytes Relative 05/22/2021 13  % Final   Monocytes Absolute 05/22/2021 0.9  0.1 - 1.0 K/uL Final   Eosinophils Relative 05/22/2021 8  % Final   Eosinophils Absolute 05/22/2021 0.6 (A) 0.0 - 0.5 K/uL Final   Basophils Relative 05/22/2021 1  % Final   Basophils Absolute 05/22/2021 0.0  0.0 - 0.1 K/uL Final   Immature Granulocytes 05/22/2021 0  % Final   Abs Immature Granulocytes 05/22/2021 0.02  0.00 - 0.07 K/uL Final   Performed at [facility], [place] [address], [place], [place] [address]   Sodium 05/22/2021 137  135 - 145 mmol/L Final   Potassium 05/22/2021 4.3  3.5 - 5.1 mmol/L Final   Chloride 05/22/2021 100  98 - 111 mmol/L Final   CO2 05/22/2021 26  22 - 32 mmol/L Final   Glucose, Bld 05/22/2021 77  70 - 99 mg/dL Final   Glucose reference range applies only to samples taken after fasting for at least 8 hours.   BUN 05/22/2021 15  6 - 20 mg/dL Final   Creatinine, Ser 05/22/2021 0.65  0.61 - 1.24 mg/dL Final   Calcium 05/22/2021 9.2  8.9 - 10.3 mg/dL Final   Total Protein 05/22/2021 7.4  6.5 - 8.1 g/dL Final  POST-ULTRACENTRIFUGATION   Albumin 05/22/2021 3.6  3.5 - 5.0 g/dL Final   AST 05/22/2021 94 (A) 15 - 41 U/L Final   ALT 05/22/2021 108 (A) 0 - 44 U/L Final   Alkaline Phosphatase 05/22/2021 92  38 - 126 U/L Final   Total Bilirubin 05/22/2021 0.4  0.3 - 1.2 mg/dL Final   GFR, Estimated 05/22/2021 >60  >60 mL/min Final   Comment: (NOTE) Calculated using the CKD-EPI Creatinine Equation (2021)    Anion gap 05/22/2021 11  5 - 15 Final   Performed at [facility],  [place] [address] [place], [place] [address]   Hgb A1c MFr Bld 05/22/2021 5.4  4.8 - 5.6 % Final   Comment: (NOTE) Pre diabetes:          5.7%-6.4%  Diabetes:              >6.4%  Glycemic control for   <7.0% adults with diabetes    Mean Plasma Glucose 05/22/2021 108.28  mg/dL Final   Performed at [facility], [place] [address] [place], [place] [address]   Magnesium 05/22/2021 2.3  1.7 - 2.4 mg/dL Final   Performed at [place] [address] [place], [place] [address]   Alcohol, Ethyl (B) 05/22/2021 <10  <10 mg/dL Final   Comment: (NOTE) Lowest detectable limit for serum alcohol is 10 mg/dL.  For medical purposes only. Performed at [facility], [place] [address] [place], [place] [address]    TSH 05/22/2021 0.405  0.350 - 4.500 uIU/mL Final   Comment: Performed by a 3rd Generation assay with a functional sensitivity of <=0.01 uIU/mL. Performed at [facility], [place] [address] [place], [place] [address]    Color, Urine 05/22/2021 YELLOW  YELLOW Final   APPearance 05/22/2021 HAZY (A) CLEAR Final   Specific Gravity, Urine 05/22/2021 1.031 (A) 1.005 - 1.030 Final   pH 05/22/2021 5.0  5.0 - 8.0 Final   Glucose, UA 05/22/2021 NEGATIVE  NEGATIVE mg/dL Final   Hgb urine dipstick 05/22/2021 NEGATIVE  NEGATIVE Final   Bilirubin Urine 05/22/2021 NEGATIVE  NEGATIVE Final   Ketones, ur 05/22/2021 NEGATIVE  NEGATIVE mg/dL Final   Protein, ur 05/22/2021 NEGATIVE  NEGATIVE mg/dL Final   Nitrite 05/22/2021 NEGATIVE  NEGATIVE Final   Leukocytes,Ua 05/22/2021 NEGATIVE  NEGATIVE Final   RBC / HPF 05/22/2021 0-5  0 - 5 RBC/hpf Final   WBC, UA 05/22/2021 0-5  0 - 5 WBC/hpf Final   Bacteria, UA 05/22/2021 RARE (A) NONE SEEN Final   Squamous Epithelial / LPF 05/22/2021 0-5  0 - 5 Final   Mucus 05/22/2021 PRESENT   Final   Hyaline Casts, UA 05/22/2021 PRESENT   Final   Performed at [facility], [place] [address] [place], [place] [address]   POC Amphetamine UR 05/22/2021  Positive (A) NONE DETECTED (Cut Off Level 1000 ng/mL) Final   POC Secobarbital (BAR) 05/22/2021 None Detected  NONE DETECTED (Cut Off Level 300 ng/mL) Final   POC Buprenorphine (BUP) 05/22/2021 None Detected  NONE DETECTED (Cut Off Level 10 ng/mL) Final   POC Oxazepam (BZO) 05/22/2021 Positive (A) NONE DETECTED (Cut Off Level 300 ng/mL) Final   POC Cocaine UR 05/22/2021 None Detected  NONE DETECTED (Cut Off Level 300 ng/mL) Final   POC Methamphetamine UR 05/22/2021 Positive (A) NONE DETECTED (Cut Off Level 1000 ng/mL) Final   POC Morphine 05/22/2021 Positive (A) NONE DETECTED (Cut Off Level 300 ng/mL) Final  POC Oxycodone UR 05/22/2021 None Detected  NONE DETECTED (Cut Off Level 100 ng/mL) Final   POC Methadone UR 05/22/2021 None Detected  NONE DETECTED (Cut Off Level 300 ng/mL) Final   POC Marijuana UR 05/22/2021 Positive (A) NONE DETECTED (Cut Off Level 50 ng/mL) Final   RPR Ser Ql 05/22/2021 NON REACTIVE  NON REACTIVE Final   Performed at [facility], [place] [address], [place], [place] [address]   Neisseria Gonorrhea 05/22/2021 Negative   Final   Chlamydia 05/22/2021 Negative   Final   Comment 05/22/2021 Normal Reference [place] Chlamydia - Negative   Final   Comment 05/22/2021 Normal Reference Range Neisseria Gonorrhea - Negative   Final   HIV Screen 4th Generation wRfx 05/22/2021 Non Reactive  Non Reactive Final   Performed at [facility], [place] [address], [place], [place] [address]   SARS Coronavirus 2 by RT PCR 05/22/2021 NEGATIVE  NEGATIVE Final   Comment: (NOTE) SARS-CoV-2 target nucleic acids are NOT DETECTED.  The SARS-CoV-2 RNA is generally detectable in upper respiratory specimens during the acute phase of infection. The lowest concentration of SARS-CoV-2 viral copies this assay can detect is 138 copies/mL. A negative result does not preclude SARS-Cov-2 infection and should not be used as the sole basis for treatment or other patient management decisions. A negative  result may occur with  improper specimen collection/handling, submission of specimen other than nasopharyngeal swab, presence of viral mutation(s) within the areas targeted by this assay, and inadequate number of viral copies(<138 copies/mL). A negative result must be combined with clinical observations, patient history, and epidemiological information. The expected result is Negative.  Fact Sheet for Patients:  EntrepreneurPulse.com.au  Fact Sheet for Healthcare Providers:  IncredibleEmployment.be  This test is no                          t yet approved or cleared by the [place] FDA and  has been authorized for detection and/or diagnosis of SARS-CoV-2 by FDA under an Emergency Use Authorization (EUA). This EUA will remain  in effect (meaning this test can be used) for the duration of the COVID-19 declaration under Section 564(b)(1) of the Act, 21 U.S.C.section 360bbb-3(b)(1), unless the authorization is terminated  or revoked sooner.       Influenza A by PCR 05/22/2021 NEGATIVE  NEGATIVE Final   Influenza B by PCR 05/22/2021 NEGATIVE  NEGATIVE Final   Comment: (NOTE) The Xpert Xpress SARS-CoV-2/FLU/RSV plus assay is intended as an aid in the diagnosis of influenza from Nasopharyngeal swab specimens and should not be used as a sole basis for treatment. Nasal washings and aspirates are unacceptable for Xpert Xpress SARS-CoV-2/FLU/RSV testing.  Fact Sheet for Patients: EntrepreneurPulse.com.au  Fact Sheet for Healthcare Providers: IncredibleEmployment.be  This test is not yet approved or cleared by the [place] FDA and has been authorized for detection and/or diagnosis of SARS-CoV-2 by FDA under an Emergency Use Authorization (EUA). This EUA will remain in effect (meaning this test can be used) for the duration of the COVID-19 declaration under Section 564(b)(1) of the Act, 21 U.S.C. section  360bbb-3(b)(1), unless the authorization is terminated or revoked.  Performed at [facility], [place] [address], [place], [place] [address]    Cholesterol 05/22/2021 151  0 - 200 mg/dL Final   LIPEMIC SPECIMEN   Triglycerides 05/22/2021 88  <150 mg/dL Final   HDL 05/22/2021 77  >40 mg/dL Final   Total CHOL/HDL  Ratio 05/22/2021 2.0  RATIO Final   VLDL 05/22/2021 18  0 - 40 mg/dL Final   LDL Cholesterol 05/22/2021 56  0 - 99 mg/dL Final   Comment:        Total Cholesterol/HDL:CHD Risk Coronary Heart Disease Risk Table                     Men   Women  1/2 Average Risk   3.4   3.3  Average Risk       5.0   4.4  2 X Average Risk   9.6   7.1  3 X Average Risk  23.4   11.0        Use the calculated Patient Ratio above and the CHD Risk Table to determine the patient's CHD Risk.        ATP III CLASSIFICATION (LDL):  <100     mg/dL   Optimal  100-129  mg/dL   Near or Above                    Optimal  130-159  mg/dL   Borderline  160-189  mg/dL   High  >190     mg/dL   Very High Performed at [place] [address], [place], [place] [address]   Hospital Outpatient Visit on 05/22/2021  Component Date Value Ref Range Status   SARS Coronavirus 2 by RT PCR 05/22/2021 NEGATIVE  NEGATIVE Final   Comment: (NOTE) SARS-CoV-2 target nucleic acids are NOT DETECTED.  The SARS-CoV-2 RNA is generally detectable in upper respiratory specimens during the acute phase of infection. The lowest concentration of SARS-CoV-2 viral copies this assay can detect is 138 copies/mL. A negative result does not preclude SARS-Cov-2 infection and should not be used as the sole basis for treatment or other patient management decisions. A negative result may occur with  improper specimen collection/handling, submission of specimen other than nasopharyngeal swab, presence of viral mutation(s) within the areas targeted by this assay, and inadequate number of viral copies(<138 copies/mL). A negative  result must be combined with clinical observations, patient history, and epidemiological information. The expected result is Negative.  Fact Sheet for Patients:  EntrepreneurPulse.com.au  Fact Sheet for Healthcare Providers:  IncredibleEmployment.be  This test is no                          t yet approved or cleared by the [place] FDA and  has been authorized for detection and/or diagnosis of SARS-CoV-2 by FDA under an Emergency Use Authorization (EUA). This EUA will remain  in effect (meaning this test can be used) for the duration of the COVID-19 declaration under Section 564(b)(1) of the Act, 21 U.S.C.section 360bbb-3(b)(1), unless the authorization is terminated  or revoked sooner.       Influenza A by PCR 05/22/2021 NEGATIVE  NEGATIVE Final   Influenza B by PCR 05/22/2021 NEGATIVE  NEGATIVE Final   Comment: (NOTE) The Xpert Xpress SARS-CoV-2/FLU/RSV plus assay is intended as an aid in the diagnosis of influenza from Nasopharyngeal swab specimens and should not be used as a sole basis for treatment. Nasal washings and aspirates are unacceptable for Xpert Xpress SARS-CoV-2/FLU/RSV testing.  Fact Sheet for Patients: EntrepreneurPulse.com.au  Fact Sheet for Healthcare Providers: IncredibleEmployment.be  This test is not yet approved or cleared by the [place] FDA and has been authorized for detection and/or diagnosis of SARS-CoV-2 by FDA under an  Emergency Use Authorization (EUA). This EUA will remain in effect (meaning this test can be used) for the duration of the COVID-19 declaration under Section 564(b)(1) of the Act, 21 U.S.C. section 360bbb-3(b)(1), unless the authorization is terminated or revoked.  Performed at [facility], [place] [address], [place], [place] [address]    SARSCOV2ONAVIRUS 2 AG 05/22/2021 NEGATIVE  NEGATIVE Final   Comment: (NOTE) SARS-CoV-2  antigen NOT DETECTED.   Negative results are presumptive.  Negative results do not preclude SARS-CoV-2 infection and should not be used as the sole basis for treatment or other patient management decisions, including infection  control decisions, particularly in the presence of clinical signs and  symptoms consistent with COVID-19, or in those who have been in contact with the virus.  Negative results must be combined with clinical observations, patient history, and epidemiological information. The expected result is Negative.  Fact Sheet for Patients: HandmadeRecipes.com.cy  Fact Sheet for Healthcare Providers: FuneralLife.at  This test is not yet approved or cleared by the [place] FDA and  has been authorized for detection and/or diagnosis of SARS-CoV-2 by FDA under an Emergency Use Authorization (EUA).  This EUA will remain in effect (meaning this test can be used) for the duration of  the COV                          ID-19 declaration under Section 564(b)(1) of the Act, 21 U.S.C. section 360bbb-3(b)(1), unless the authorization is terminated or revoked sooner.    Admission on 05/14/2021, Discharged on 05/15/2021  Component Date Value Ref Range Status   WBC 05/14/2021 14.5 (A) 4.0 - 10.5 K/uL Final   RBC 05/14/2021 4.91  4.22 - 5.81 MIL/uL Final   Hemoglobin 05/14/2021 14.3  13.0 - 17.0 g/dL Final   HCT 05/14/2021 41.0  39.0 - 52.0 % Final   MCV 05/14/2021 83.5  80.0 - 100.0 fL Final   MCH 05/14/2021 29.1  26.0 - 34.0 pg Final   MCHC 05/14/2021 34.9  30.0 - 36.0 g/dL Final   RDW 05/14/2021 11.6  11.5 - 15.5 % Final   Platelets 05/14/2021 382  150 - 400 K/uL Final   nRBC 05/14/2021 0.0  0.0 - 0.2 % Final   Neutrophils Relative % 05/14/2021 61  % Final   Neutro Abs 05/14/2021 8.9 (A) 1.7 - 7.7 K/uL Final   Lymphocytes Relative 05/14/2021 29  % Final   Lymphs Abs 05/14/2021 4.1 (A) 0.7 - 4.0 K/uL Final   Monocytes Relative  05/14/2021 9  % Final   Monocytes Absolute 05/14/2021 1.2 (A) 0.1 - 1.0 K/uL Final   Eosinophils Relative 05/14/2021 0  % Final   Eosinophils Absolute 05/14/2021 0.1  0.0 - 0.5 K/uL Final   Basophils Relative 05/14/2021 1  % Final   Basophils Absolute 05/14/2021 0.1  0.0 - 0.1 K/uL Final   Immature Granulocytes 05/14/2021 0  % Final   Abs Immature Granulocytes 05/14/2021 0.04  0.00 - 0.07 K/uL Final   Performed at [facility], [place] [address] [place], [place] [address]   Opiates 05/14/2021 NONE DETECTED  NONE DETECTED Final   Cocaine 05/14/2021 NONE DETECTED  NONE DETECTED Final   Benzodiazepines 05/14/2021 NONE DETECTED  NONE DETECTED Final   Amphetamines 05/14/2021 POSITIVE (A) NONE DETECTED Final   Tetrahydrocannabinol 05/14/2021 NONE DETECTED  NONE DETECTED Final   Barbiturates 05/14/2021 NONE DETECTED  NONE DETECTED Final   Comment: (NOTE) DRUG SCREEN FOR MEDICAL  PURPOSES ONLY.  IF CONFIRMATION IS NEEDED FOR ANY PURPOSE, NOTIFY LAB WITHIN 5 DAYS.  LOWEST DETECTABLE LIMITS FOR URINE DRUG SCREEN Drug Class                     Cutoff (ng/mL) Amphetamine and metabolites    1000 Barbiturate and metabolites    200 Benzodiazepine                 [id number] Tricyclics and metabolites     300 Opiates and metabolites        300 Cocaine and metabolites        300 THC                            50 Performed at [facility], [place] [address] [place], [place] [address]    Alcohol, Ethyl (B) 05/14/2021 <10  <10 mg/dL Final   Comment: (NOTE) Lowest detectable limit for serum alcohol is 10 mg/dL.  For medical purposes only. Performed at [facility], [place] [address], [place], [place] [address]    Sodium 05/14/2021 135  135 - 145 mmol/L Final   Potassium 05/14/2021 3.8  3.5 - 5.1 mmol/L Final   Chloride 05/14/2021 105  98 - 111 mmol/L Final   CO2 05/14/2021 23  22 - 32 mmol/L Final   Glucose, Bld 05/14/2021 118 (A) 70 - 99 mg/dL Final    Glucose reference range applies only to samples taken after fasting for at least 8 hours.   BUN 05/14/2021 21 (A) 6 - 20 mg/dL Final   Creatinine, Ser 05/14/2021 0.79  0.61 - 1.24 mg/dL Final   Calcium 05/14/2021 10.0  8.9 - 10.3 mg/dL Final   Total Protein 05/14/2021 9.0 (A) 6.5 - 8.1 g/dL Final   Albumin 05/14/2021 4.2  3.5 - 5.0 g/dL Final   AST 05/14/2021 114 (A) 15 - 41 U/L Final   ALT 05/14/2021 106 (A) 0 - 44 U/L Final   Alkaline Phosphatase 05/14/2021 91  38 - 126 U/L Final   Total Bilirubin 05/14/2021 1.0  0.3 - 1.2 mg/dL Final   GFR, Estimated 05/14/2021 >60  >60 mL/min Final   Comment: (NOTE) Calculated using the CKD-EPI Creatinine Equation (2021)    Anion gap 05/14/2021 7  5 - 15 Final   Performed at [facility], [place] [address], [place], [place] [address]   SARS Coronavirus 2 by RT PCR 05/14/2021 NEGATIVE  NEGATIVE Final   Comment: (NOTE) SARS-CoV-2 target nucleic acids are NOT DETECTED.  The SARS-CoV-2 RNA is generally detectable in upper respiratory specimens during the acute phase of infection. The lowest concentration of SARS-CoV-2 viral copies this assay can detect is 138 copies/mL. A negative result does not preclude SARS-Cov-2 infection and should not be used as the sole basis for treatment or other patient management decisions. A negative result may occur with  improper specimen collection/handling, submission of specimen other than nasopharyngeal swab, presence of viral mutation(s) within the areas targeted by this assay, and inadequate number of viral copies(<138 copies/mL). A negative result must be combined with clinical observations, patient history, and epidemiological information. The expected result is Negative.  Fact Sheet for Patients:  EntrepreneurPulse.com.au  Fact Sheet for Healthcare Providers:  IncredibleEmployment.be  This test is no                          t yet  approved or cleared  by the [place] and  has been authorized for detection and/or diagnosis of SARS-CoV-2 by FDA under an Emergency Use Authorization (EUA). This EUA will remain  in effect (meaning this test can be used) for the duration of the COVID-19 declaration under Section 564(b)(1) of the Act, 21 U.S.C.section 360bbb-3(b)(1), unless the authorization is terminated  or revoked sooner.       Influenza A by PCR 05/14/2021 NEGATIVE  NEGATIVE Final   Influenza B by PCR 05/14/2021 NEGATIVE  NEGATIVE Final   Comment: (NOTE) The Xpert Xpress SARS-CoV-2/FLU/RSV plus assay is intended as an aid in the diagnosis of influenza from Nasopharyngeal swab specimens and should not be used as a sole basis for treatment. Nasal washings and aspirates are unacceptable for Xpert Xpress SARS-CoV-2/FLU/RSV testing.  Fact Sheet for Patients: EntrepreneurPulse.com.au  Fact Sheet for Healthcare Providers: IncredibleEmployment.be  This test is not yet approved or cleared by the [place] FDA and has been authorized for detection and/or diagnosis of SARS-CoV-2 by FDA under an Emergency Use Authorization (EUA). This EUA will remain in effect (meaning this test can be used) for the duration of the COVID-19 declaration under Section 564(b)(1) of the Act, 21 U.S.C. section 360bbb-3(b)(1), unless the authorization is terminated or revoked.  Performed at [facility], [place] [address], [place], [place] [address]   Admission on 03/03/2021, Discharged on 03/04/2021  Component Date Value Ref Range Status   Sodium 03/04/2021 137  135 - 145 mmol/L Final   Potassium 03/04/2021 4.0  3.5 - 5.1 mmol/L Final   Chloride 03/04/2021 100  98 - 111 mmol/L Final   CO2 03/04/2021 26  22 - 32 mmol/L Final   Glucose, Bld 03/04/2021 104 (A) 70 - 99 mg/dL Final   Glucose reference range applies only to samples taken after fasting for at least 8 hours.   BUN 03/04/2021 15  6 -  20 mg/dL Final   Creatinine, Ser 03/04/2021 0.93  0.61 - 1.24 mg/dL Final   Calcium 03/04/2021 10.4 (A) 8.9 - 10.3 mg/dL Final   Total Protein 03/04/2021 9.4 (A) 6.5 - 8.1 g/dL Final   Albumin 03/04/2021 4.4  3.5 - 5.0 g/dL Final   AST 03/04/2021 65 (A) 15 - 41 U/L Final   ALT 03/04/2021 97 (A) 0 - 44 U/L Final   Alkaline Phosphatase 03/04/2021 85  38 - 126 U/L Final   Total Bilirubin 03/04/2021 1.0  0.3 - 1.2 mg/dL Final   GFR, Estimated 03/04/2021 >60  >60 mL/min Final   Comment: (NOTE) Calculated using the CKD-EPI Creatinine Equation (2021)    Anion gap 03/04/2021 11  5 - 15 Final   Performed at [place] [address] [place], [place] [address]   Alcohol, Ethyl (B) 03/04/2021 <10  <10 mg/dL Final   Comment: (NOTE) Lowest detectable limit for serum alcohol is 10 mg/dL.  For medical purposes only. Performed at [facility], [place] [address] [place], [place] [address]    WBC 03/04/2021 12.6 (A) 4.0 - 10.5 K/uL Final   RBC 03/04/2021 5.16  4.22 - 5.81 MIL/uL Final   Hemoglobin 03/04/2021 15.3  13.0 - 17.0 g/dL Final   HCT 03/04/2021 46.1  39.0 - 52.0 % Final   MCV 03/04/2021 89.3  80.0 - 100.0 fL Final   MCH 03/04/2021 29.7  26.0 - 34.0 pg Final   MCHC 03/04/2021 33.2  30.0 - 36.0 g/dL Final   RDW 03/04/2021 11.9  11.5 -  15.5 % Final   Platelets 03/04/2021 417 (A) 150 - 400 K/uL Final   nRBC 03/04/2021 0.0  0.0 - 0.2 % Final   Performed at [facility], [place] [address] [place], [place] [address]   Opiates 03/03/2021 POSITIVE (A) NONE DETECTED Final   Cocaine 03/03/2021 NONE DETECTED  NONE DETECTED Final   Benzodiazepines 03/03/2021 NONE DETECTED  NONE DETECTED Final   Amphetamines 03/03/2021 POSITIVE (A) NONE DETECTED Final   Tetrahydrocannabinol 03/03/2021 NONE DETECTED  NONE DETECTED Final   Barbiturates 03/03/2021 NONE DETECTED  NONE DETECTED Final   Comment: (NOTE) DRUG SCREEN FOR MEDICAL PURPOSES ONLY.  IF CONFIRMATION IS NEEDED FOR ANY PURPOSE,  NOTIFY LAB WITHIN 5 DAYS.  LOWEST DETECTABLE LIMITS FOR URINE DRUG SCREEN Drug Class                     Cutoff (ng/mL) Amphetamine and metabolites    1000 Barbiturate and metabolites    200 Benzodiazepine                 [id number] Tricyclics and metabolites     300 Opiates and metabolites        300 Cocaine and metabolites        300 THC                            50 Performed at [facility], [place] [address] [place], [place] [address]   Admission on 02/20/2021, Discharged on 02/20/2021  Component Date Value Ref Range Status   SARS Coronavirus 2 by RT PCR 02/20/2021 NEGATIVE  NEGATIVE Final   Comment: (NOTE) SARS-CoV-2 target nucleic acids are NOT DETECTED.  The SARS-CoV-2 RNA is generally detectable in upper respiratory specimens during the acute phase of infection. The lowest concentration of SARS-CoV-2 viral copies this assay can detect is 138 copies/mL. A negative result does not preclude SARS-Cov-2 infection and should not be used as the sole basis for treatment or other patient management decisions. A negative result may occur with  improper specimen collection/handling, submission of specimen other than nasopharyngeal swab, presence of viral mutation(s) within the areas targeted by this assay, and inadequate number of viral copies(<138 copies/mL). A negative result must be combined with clinical observations, patient history, and epidemiological information. The expected result is Negative.  Fact Sheet for Patients:  EntrepreneurPulse.com.au  Fact Sheet for Healthcare Providers:  IncredibleEmployment.be  This test is no                          t yet approved or cleared by the [place] FDA and  has been authorized for detection and/or diagnosis of SARS-CoV-2 by FDA under an Emergency Use Authorization (EUA). This EUA will remain  in effect (meaning this test can be used) for the duration of the COVID-19 declaration under  Section 564(b)(1) of the Act, 21 U.S.C.section 360bbb-3(b)(1), unless the authorization is terminated  or revoked sooner.       Influenza A by PCR 02/20/2021 NEGATIVE  NEGATIVE Final   Influenza B by PCR 02/20/2021 NEGATIVE  NEGATIVE Final   Comment: (NOTE) The Xpert Xpress SARS-CoV-2/FLU/RSV plus assay is intended as an aid in the diagnosis of influenza from Nasopharyngeal swab specimens and should not be used as a sole basis for treatment. Nasal washings and aspirates are unacceptable for Xpert Xpress SARS-CoV-2/FLU/RSV testing.  Fact Sheet for Patients: EntrepreneurPulse.com.au  Fact Sheet  for Healthcare Providers: IncredibleEmployment.be  This test is not yet approved or cleared by the [place] and has been authorized for detection and/or diagnosis of SARS-CoV-2 by FDA under an Emergency Use Authorization (EUA). This EUA will remain in effect (meaning this test can be used) for the duration of the COVID-19 declaration under Section 564(b)(1) of the Act, 21 U.S.C. section 360bbb-3(b)(1), unless the authorization is terminated or revoked.  Performed at [facility], [place] [address] [place], [place] [address]    Sodium 02/20/2021 137  135 - 145 mmol/L Final   Potassium 02/20/2021 3.4 (A) 3.5 - 5.1 mmol/L Final   Chloride 02/20/2021 101  98 - 111 mmol/L Final   CO2 02/20/2021 28  22 - 32 mmol/L Final   Glucose, Bld 02/20/2021 108 (A) 70 - 99 mg/dL Final   Glucose reference range applies only to samples taken after fasting for at least 8 hours.   BUN 02/20/2021 5 (A) 6 - 20 mg/dL Final   Creatinine, Ser 02/20/2021 0.74  0.61 - 1.24 mg/dL Final   Calcium 02/20/2021 9.2  8.9 - 10.3 mg/dL Final   GFR, Estimated 02/20/2021 >60  >60 mL/min Final   Comment: (NOTE) Calculated using the CKD-EPI Creatinine Equation (2021)    Anion gap 02/20/2021 8  5 - 15 Final   Performed at [facility], [place] [address], [place], [place]  [address]   WBC 02/20/2021 7.6  4.0 - 10.5 K/uL Final   RBC 02/20/2021 4.39  4.22 - 5.81 MIL/uL Final   Hemoglobin 02/20/2021 13.1  13.0 - 17.0 g/dL Final   HCT 02/20/2021 40.4  39.0 - 52.0 % Final   MCV 02/20/2021 92.0  80.0 - 100.0 fL Final   MCH 02/20/2021 29.8  26.0 - 34.0 pg Final   MCHC 02/20/2021 32.4  30.0 - 36.0 g/dL Final   RDW 02/20/2021 12.0  11.5 - 15.5 % Final   Platelets 02/20/2021 400  150 - 400 K/uL Final   nRBC 02/20/2021 0.0  0.0 - 0.2 % Final   Neutrophils Relative % 02/20/2021 72  % Final   Neutro Abs 02/20/2021 5.4  1.7 - 7.7 K/uL Final   Lymphocytes Relative 02/20/2021 21  % Final   Lymphs Abs 02/20/2021 1.6  0.7 - 4.0 K/uL Final   Monocytes Relative 02/20/2021 6  % Final   Monocytes Absolute 02/20/2021 0.5  0.1 - 1.0 K/uL Final   Eosinophils Relative 02/20/2021 0  % Final   Eosinophils Absolute 02/20/2021 0.0  0.0 - 0.5 K/uL Final   Basophils Relative 02/20/2021 1  % Final   Basophils Absolute 02/20/2021 0.1  0.0 - 0.1 K/uL Final   Immature Granulocytes 02/20/2021 0  % Final   Abs Immature Granulocytes 02/20/2021 0.02  0.00 - 0.07 K/uL Final   Performed at [facility], [place] [address] [place], [place] [address]    Blood Alcohol level:  Lab Results  Component Value Date   ETH <10 05/22/2021   ETH <10 [id number]    Metabolic Disorder Labs: Lab Results  Component Value Date   HGBA1C 5.4 05/22/2021   MPG 108.28 05/22/2021   No results found for: PROLACTIN Lab Results  Component Value Date   CHOL 151 05/22/2021   TRIG 88 05/22/2021   HDL 77 05/22/2021   CHOLHDL 2.0 05/22/2021   VLDL 18 05/22/2021   LDLCALC 56 05/22/2021    Therapeutic Lab Levels: No results found for: LITHIUM No results found for: VALPROATE  No components found for:  CBMZ  Physical Findings   PHQ2-9    Flowsheet Row OP Visit from 05/22/2021 in [place]  PHQ-2 Total Score 3  PHQ-9 Total Score 13      Flowsheet Row ED from 05/22/2021  in [facility] Most recent reading at 05/22/2021 11:00 PM OP Visit from 05/22/2021 in [place] Most recent reading at 05/22/2021  4:40 PM ED from 05/21/2021 in [facility] Most recent reading at 05/21/2021  9:54 PM  C-SSRS RISK CATEGORY High Risk High Risk No Risk        Musculoskeletal  Strength & Muscle Tone: within normal limits Gait & Station: normal Patient leans: N/A  Psychiatric Specialty Exam  Presentation  General Appearance: Appropriate for Environment; Disheveled  Eye Contact: Fair   Speech:Clear and Coherent; Normal Rate  Speech Volume: Moderate  Handedness:Right   Mood and Affect  Mood:Anxious; Depressed; Dysphoric  Affect:Congruent   Thought Process  Thought Processes:Coherent; Goal Directed; Linear  Descriptions of Associations:Intact  Orientation:Full (Time, Place and Person)  Thought Content:WDL  Diagnosis of Schizophrenia or Schizoaffective disorder in past: No    Hallucinations:Hallucinations: None  Ideas of Reference:None  Suicidal Thoughts: Not actively  Homicidal Thoughts:Homicidal Thoughts: No   Sensorium  Memory: Poor immediate, does not recall previous day's events   Judgment:Intact  Insight:Present   Executive Functions  Concentration:Fair  Attention Span:Fair  [place]   Psychomotor Activity  Psychomotor Activity:Psychomotor Activity: Normal   Assets  Assets:Communication Skills; Desire for Improvement; Physical Health   Sleep  Sleep:Sleep: Poor   No data recorded  Physical Exam  Physical Exam Constitutional:      Appearance: Normal appearance.  HENT:     Head: Normocephalic.     Nose: Rhinorrhea present.  Pulmonary:     Effort: Pulmonary effort is normal.  Neurological:     General: No focal deficit present.     Mental Status: He is alert and oriented to person,  place, and time.  Psychiatric:        Thought Content: Thought content normal.   Review of Systems  Constitutional:  Positive for malaise/fatigue.  HENT:  Positive for congestion and ear pain.        +rhinnorea, watery eyes  Gastrointestinal:  Positive for abdominal pain, diarrhea and nausea. Negative for vomiting.  Musculoskeletal:  Positive for back pain and myalgias.  Neurological:  Negative for headaches.  Psychiatric/Behavioral:  Positive for depression. Negative for hallucinations and suicidal ideas. The patient is nervous/anxious.   Blood pressure (!) 145/88, pulse 82, temperature 97.9 F (36.6 C), temperature source Oral, resp. rate 16, SpO2 100 %. There is no height or weight on file to calculate BMI.  Treatment Plan Summary: 30 yo male with a history of substance who [facility] requesting detox from opioids and reported SI with a plan to hang himself. Patient as accepted to [facility] 9/13 for continued treatment. Labs reviewed-CBC, lipid panel unremarkable. TSH wnl (0.405), RPR nonreactive, a1c wnl (5.4). CBC with mildly elevated AST and ALT at 94 and 108 respectively. UDS+ amphetamine, methamphetamine, oxazepam, morphine, and marijuana. GC/chlamydia negative. Hepatitis panel in process. VSS. This morning, patient report sx c/w opioid withdrawal, but notable improvement from yesterday. COWS 7Will continue symptomatic treatment of opioid withdrawal and coordinate with SW for dispo planning to treatment facility as per his interest.   Substance induced mood disorder -at this time,  does not appear that patient meets criteria for MDD or bipolar disorder based on history -will consult with pharmacy regarding his recently prescribed medications at [place] [facility] -Patient expresses interest in substance use treatment-SW assisting with possible rehab placement   Opioid abuse with withdrawal -f/u hepatitis panel  Opiate Withdrawal Protocol: -continue  Clonidine taper as started by previous provider. -Bentyl '10mg'$  PO q6hr PRN for abdominal muscle cramps -Loperamide '2mg'$  PO q6hr PRN for diarrhea -Robaxin '750mg'$  PO q8hr PRN for muscle spasm -Zofran '4mg'$  SL q8hr PRN for nausea -Vistaril '25mg'$  PO q8hr PRN for anxiety  Dispo: coordinating with SW for potential placement in treatment program.  - interest in outpatient grief counseling   Ival Bible, MD 05/24/2021 6:28 PM

## 2021-05-24 NOTE — ED Notes (Signed)
Client did not participate in class.

## 2021-05-24 NOTE — ED Notes (Signed)
Patient denies pain and is resting comfortably.  

## 2021-05-25 DIAGNOSIS — Z20822 Contact with and (suspected) exposure to covid-19: Secondary | ICD-10-CM | POA: Diagnosis not present

## 2021-05-25 DIAGNOSIS — F1994 Other psychoactive substance use, unspecified with psychoactive substance-induced mood disorder: Secondary | ICD-10-CM | POA: Diagnosis not present

## 2021-05-25 DIAGNOSIS — F1520 Other stimulant dependence, uncomplicated: Secondary | ICD-10-CM | POA: Diagnosis not present

## 2021-05-25 DIAGNOSIS — F1190 Opioid use, unspecified, uncomplicated: Secondary | ICD-10-CM | POA: Diagnosis not present

## 2021-05-25 MED ORDER — CLONIDINE HCL 0.1 MG PO TABS: ORAL_TABLET | ORAL | 0 refills | Status: AC

## 2021-05-25 NOTE — Progress Notes (Signed)
Patient anxious to leave  Reviewed AVS with patient and Clonidine samples provided.  zAll questions answered.  Instructed patient to go to med window and staff would give AM med prior to discharge.  Patient refused. Patient stated his ride was almost out front and would not wait for him. Patient stated he needed to discharge now.  Patient denied SI, HI, AVH.  Stated he feels safe and ready for discharge.  Patient ambulated independently to lobby without issue.  Patient discharged in stable condition.  No acute distress noted.

## 2021-05-25 NOTE — Progress Notes (Signed)
Patient requesting to discharge.  Provider notified via secure chat.

## 2021-05-25 NOTE — ED Provider Notes (Signed)
[facility] Discharge Suicide Risk Assessment   Principal Problem: Substance induced mood disorder ([place]) Discharge Diagnoses: Principal Problem:   Substance induced mood disorder ([place]) Active Problems:   IVDU (intravenous drug user)   Total Time spent with patient: 15 minutes  Musculoskeletal: Strength & Muscle Tone: within normal limits Gait & Station: normal Patient leans: N/A  Psychiatric Specialty Exam  Presentation  General Appearance: Appropriate for Environment; Casual  Eye Contact:Fair  Speech:Clear and Coherent; Normal Rate  Speech Volume:Normal  Handedness:Right   Mood and Affect  Mood:Dysphoric  Duration of Depression Symptoms: Greater than two weeks  Affect:Congruent; Appropriate   Thought Process  Thought Processes:Coherent; Goal Directed; Linear  Descriptions of Associations:Intact  Orientation:Full (Time, Place and Person)  Thought Content:WDL; Logical  History of Schizophrenia/Schizoaffective disorder:No  Duration of Psychotic Symptoms:No data recorded Hallucinations:Hallucinations: None Ideas of Reference:None  Suicidal Thoughts:Suicidal Thoughts: No Homicidal Thoughts:Homicidal Thoughts: No  Sensorium  Memory:Immediate Good; Recent Good; Remote Good  Judgment:Intact  Insight:Present (patient generally has fair insight; however, insight into substance use appears to be lacking)   Executive Functions  Concentration:Good  Attention Span:Good  [place] of Knowledge:Good  Language:Good   Psychomotor Activity  Psychomotor Activity: Psychomotor Activity: Normal  Assets  Assets:Communication Skills; Desire for Improvement; Resilience; Physical Health   Sleep  Sleep: Sleep: Fair  Physical Exam: Physical Exam ROS Blood pressure (!) 128/93, pulse 77, temperature 98 F (36.7 C), temperature source Tympanic, resp. rate 18, SpO2 100 %. There is no height or weight on file to calculate BMI.  Mental Status Per Nursing  Assessment::   On Admission:     Demographic Factors:  Male, Low socioeconomic status, and Unemployed  Loss Factors: Financial problems/change in socioeconomic status  Historical Factors: Prior suicide attempts and Impulsivity  Risk Reduction Factors:   Sense of responsibility to family and Positive social support  Continued Clinical Symptoms:  Alcohol/Substance Abuse/Dependencies  Cognitive Features That Contribute To Risk:  Thought constriction (tunnel vision)    Suicide Risk:  Minimal: No identifiable suicidal ideation.  Patients presenting with no risk factors but with morbid ruminations; may be classified as minimal risk based on the severity of the depressive symptoms   Follow-up [place]. Go to.   Specialty: Behavioral Health Why: Please go during walk-in hours to establish outpatient psychiatris services, such as medication management and therapy.   Medication Management Walk-In Hours:  Monday-Friday from 8:00am-11:00am. Please arrive between 7:30am-7:45am, as patients are seen on a first come, first served basis.   Therapy Walk-In Hours  Monday-Wednesday from 8:00am-until slots are full. Please arrive between 7:30am-7:45am, as patients are seen on a first come, first served basis  Friday walk-in hours are from 1:00pm-5:00pm. Contact information: [facility]. Call.   Why: A referral has been made on your behalf. Please contact on a daily basis to determine any updates regarding your position on the bed wait list. Contact information: [address] [place] [address] [phone]         [facility]. Call.   Why: Pleae contact to establish outpatient substance use services. Please be sure to have any discharge paperwork from this encounter, inlcuding a list of medications you are  prescribed. Contact information: [place], [place] [address]  Office: [phone] Fax: [phone]  Plan Of Care/Follow-up recommendations:  Activity:  as tolerated Diet:  regular Other:    Patient is instructed prior to discharge to: Take all medications as prescribed by his/her mental healthcare provider. Report any adverse effects and or reactions from the medicines to his/her outpatient provider promptly. Patient has been instructed & cautioned: To not engage in alcohol and or illegal drug use while on prescription medicines. In the event of worsening symptoms, patient is instructed to call the crisis hotline, 911 and or go to the nearest ED for appropriate evaluation and treatment of symptoms. To follow-up with his/her primary care provider for your other medical issues, concerns and or health care needs.    Patient was provided with a sample of clonidine 0.1 mg for the remainder of his clonidine taper 0.1 mg BID 9/16 and 9/17 and one dose in the AM on 9/18. Discussed instructions for taper and patient verbalized understanding  Ival Bible, MD 05/25/2021, 8:54 AM

## 2021-05-25 NOTE — ED Provider Notes (Signed)
FBC/OBS ASAP Discharge Summary  Date and Time: 05/25/2021 8:22 AM  Name: Calvin Sanchez  MRN:  [id number]   Discharge Diagnoses:  Final diagnoses:  Substance induced mood disorder ([place])  Heroin use disorder, severe ([place])  Methamphetamine use disorder, severe ([place])    Subjective:  Patient seen and chart reviewed. Prior to patient being seen, I was informed by RN that patient was requesting discharge. Patient interviewed in his room this AM and he immediately states " am ready to discharge". He denies SI/HI/AVH. He describes his mood as "good" although appears objectively anxious. He denies opioid withdrawal sx of dysphoric mood, nausea, vomiting, ,muscle aches, lacrimation, rhinorrhea, diarrhea. He states that he has a friend who is already on the way to pick him up. Discussed with patient that he is currently completing a clonidine taper to assist him with opioid withdrawal symptoms and that he will be provided a script for the remaining doses which include 0.1 mg today at 10 am and 10 pm, tomorrow at 10 am and 10 pm and the following day once at 10 am. Patient verbalized understanding. Informed that patient had a referral that was sent on his behalf to [facility] but that he may follow up with the referral upon discharge. Pt verbalized understanding and was in agreement.    Stay Summary:  Patient initially presented to [facility] on 9/13 requesting detox from opioids and SI ; patient admitted to methamphetamine and heroin use. UDS positive for amphetamine, methamphetamine, oxazepam, morphine, and marijuana Patient as accepted to the [facility] for further treatment. Patient was started on opioid withdrawal protocol an was provided PRNs for withdrawal sx.  Upon arrival and throughout the day on 9/14 patient appeared to be objectively in withdrawal from opioids and spent the majority of the day in bed. Throughout stay patient was minimally participatory in group activities on the unit and spent the most of his stay  in bed. He received naproxyn 500 mg once on 9/14 and once on 9/15 and received zofran once on 9/14. He had reported that he was interested in substance use treatment on admission and SW referred patient out ; however, on the morning of 9/16 patient requested discharge. Informed patient that he may follow up with detox/treatment facilities at discharge. Patient verbalized understanding. See above for additional information pertaining due day of discharge interview. Patient had previously reported receiving medications through [facility], I consulted with pharmacist at the [facility] and she reported that patient would need to sign an ROI before information could be provided. Patient requested discharge prior to getting in contact with pharmacy and ROI was not signed and information was not obtained as patient was discharged.   Total Time spent with patient: 15 minutes  Past Psychiatric History: see H&P Past Medical History:  Past Medical History:  Diagnosis Date   Homeless    Substance abuse ([place])     Past Surgical History:  Procedure Laterality Date   KNEE SURGERY Left    Family History:  Family History  Problem Relation Age of Onset   Hypertension Mother    Family Psychiatric History: see H&P Social History:  Social History   Substance and Sexual Activity  Alcohol Use Yes   Comment: Once a week. Last drink: yesterday      Social History   Substance and Sexual Activity  Drug Use Yes   Types: Cocaine, Methamphetamines   Comment: heroin    Social History   Socioeconomic History   Marital status: Single  Spouse name: Not on file   Number of children: Not on file   Years of education: Not on file   Highest education level: Not on file  Occupational History   Not on file  Tobacco Use   Smoking status: Every Day    Types: Cigars   Smokeless tobacco: Never  Vaping Use   Vaping Use: Never used  Substance and Sexual Activity   Alcohol use: Yes    Comment: Once a week.  Last drink: yesterday    Drug use: Yes    Types: Cocaine, Methamphetamines    Comment: heroin   Sexual activity: Not on file  Other Topics Concern   Not on file  Social History Narrative   Not on file   Social Determinants of Health   Financial Resource Strain: Not on file  Food Insecurity: Not on file  Transportation Needs: Not on file  Physical Activity: Not on file  Stress: Not on file  Social Connections: Not on file   SDOH:  SDOH Screenings   Alcohol Screen: Not on file  Depression (PHQ2-9): Medium Risk   PHQ-2 Score: 13  Financial Resource Strain: Not on file  Food Insecurity: Not on file  Housing: Not on file  Physical Activity: Not on file  Social Connections: Not on file  Stress: Not on file  Tobacco Use: High Risk   Smoking Tobacco Use: Every Day   Smokeless Tobacco Use: Never  Transportation Needs: Not on file    Tobacco Cessation:  N/A, patient does not currently use tobacco products  Current Medications:  Current Facility-Administered Medications  Medication Dose Route Frequency Provider Last Rate Last Admin   acetaminophen (TYLENOL) tablet 650 mg  650 mg Oral Q6H PRN Revonda Humphrey, NP       alum & mag hydroxide-simeth (MAALOX/MYLANTA) 200-200-20 MG/5ML suspension 30 mL  30 mL Oral Q4H PRN Revonda Humphrey, NP       cloNIDine (CATAPRES) tablet 0.1 mg  0.1 mg Oral QID Revonda Humphrey, NP   0.1 mg at 05/24/21 2310   Followed by   cloNIDine (CATAPRES) tablet 0.1 mg  0.1 mg Oral BH-qamhs Revonda Humphrey, NP       Followed by   Derrill Memo ON 05/27/2021] cloNIDine (CATAPRES) tablet 0.1 mg  0.1 mg Oral QAC breakfast Revonda Humphrey, NP       dicyclomine (BENTYL) tablet 20 mg  20 mg Oral Q6H PRN Revonda Humphrey, NP       hydrOXYzine (ATARAX/VISTARIL) tablet 25 mg  25 mg Oral Q6H PRN Revonda Humphrey, NP       loperamide (IMODIUM) capsule 2-4 mg  2-4 mg Oral PRN Revonda Humphrey, NP       magnesium hydroxide (MILK OF MAGNESIA) suspension 30 mL   30 mL Oral Daily PRN Revonda Humphrey, NP       methocarbamol (ROBAXIN) tablet 750 mg  750 mg Oral Q8H PRN Ival Bible, MD       naproxen (NAPROSYN) tablet 500 mg  500 mg Oral BID PRN Revonda Humphrey, NP   500 mg at 05/24/21 0938   ondansetron (ZOFRAN-ODT) disintegrating tablet 4 mg  4 mg Oral Q6H PRN Revonda Humphrey, NP   4 mg at 05/23/21 1136   traZODone (DESYREL) tablet 50 mg  50 mg Oral QHS PRN Revonda Humphrey, NP       Current Outpatient Medications  Medication Sig Dispense Refill   cloNIDine (CATAPRES) 0.1 MG  tablet For opioid withdrawal symptoms: 1 tab po tid x 2 days, then bid x 2 days, then once daily x 2 days (Patient taking differently: Take 0.1 mg by mouth See admin instructions. For opioid withdrawal symptoms: 1 tab po tid x 2 days, then bid x 2 days, then once daily x 2 days) 12 tablet 0   promethazine (PHENERGAN) 25 MG tablet Take 1 tablet (25 mg total) by mouth every 6 (six) hours as needed for nausea or vomiting. 15 tablet 0    PTA Medications: (Not in a hospital admission)   Musculoskeletal  Strength & Muscle Tone: within normal limits Gait & Station: normal Patient leans: N/A  Psychiatric Specialty Exam  Presentation  General Appearance: Appropriate for Environment; Disheveled  Eye Contact:Other (comment); Poor (limited. eyes closed the majority of interview)  Speech:Clear and Coherent; Normal Rate  Speech Volume:Decreased  Handedness:Right   Mood and Affect  Mood:Anxious; Depressed; Dysphoric  Affect:Congruent   Thought Process  Thought Processes:Coherent; Goal Directed; Linear  Descriptions of Associations:Intact  Orientation:Full (Time, Place and Person)  Thought Content:WDL  Diagnosis of Schizophrenia or Schizoaffective disorder in past: No    Hallucinations:No data recorded Ideas of Reference:None  Suicidal Thoughts:No data recorded Homicidal Thoughts:No data recorded  Sensorium  Memory:Immediate Good; Recent Good;  Remote Fair  Judgment:Intact  Insight:Present   Executive Functions  Concentration:Fair  Attention Span:Fair  [place]   Psychomotor Activity  Psychomotor Activity: No data recorded  Assets  Assets:Communication Skills; Desire for Improvement; Physical Health   Sleep  Sleep: No data recorded  No data recorded  Physical Exam  See SRA for physical exam and ROS  Blood pressure (!) 128/93, pulse 77, temperature 98 F (36.7 C), temperature source Tympanic, resp. rate 18, SpO2 100 %. There is no height or weight on file to calculate BMI.  See SRA for full suicide risk assessment  Plan Of Care/Follow-up recommendations:  Activity:  as tolerated Diet:  regular Other:     Patient is instructed prior to discharge to: Take all medications as prescribed by his/her mental healthcare provider. Report any adverse effects and or reactions from the medicines to his/her outpatient provider promptly. Patient has been instructed & cautioned: To not engage in alcohol and or illegal drug use while on prescription medicines. In the event of worsening symptoms, patient is instructed to call the crisis hotline, 911 and or go to the nearest ED for appropriate evaluation and treatment of symptoms. To follow-up with his/her primary care provider for your other medical issues, concerns and or health care needs.     TAKE these medications    cloNIDine 0.1 MG tablet Commonly known as: CATAPRES Take 1 pill  at 10 AM on 9/16 , 1 pill at 10 PM on 9/16 , followed by  one pill at 10 AM 9/17 and one pill 10 PM on 9/17, followed by one pill at 10 AM on 9/18 What changed: additional instructions         Disposition: self care  Ival Bible, MD 05/25/2021, 8:22 AM

## 2021-05-25 NOTE — ED Notes (Signed)
Pt given breakfast; 3 cereal, banana, bagel with cream cheese, yogurt, coffee

## 2021-05-25 NOTE — Clinical Social Work Psych Note (Addendum)
CSW Discharge  CSW received report from Dr. Serafina Mitchell that the patient is now requesting discharge from the [facility]. Per Dr. Serafina Mitchell, the patient has been psychiatrically cleared for discharge.   Patient reports he is calling a friend that will pick him up.   CSW provided the patient's referral information to [place] for him to follow up with, as well as resources for [facility] and [facility] for outpatient substance use services.   [place] did not have any additional questions or concerns.    CSW signing off.    Radonna Ricker, MSW, LCSW Clinical Education officer, museum ([place]) [facility]

## 2021-05-25 NOTE — Discharge Instructions (Signed)
Take all medications as prescribed by his/her mental healthcare provider. Report any adverse effects and or reactions from the medicines to his/her outpatient provider promptly. Do not engage in alcohol and or illegal drug use while on prescription medicines. In the event of worsening symptoms, patient is instructed to call the crisis hotline, 911 and or go to the nearest ED for appropriate evaluation and treatment of symptoms. To follow-up with his/her primary care provider for your other medical issues, concerns and or health care needs.

## 2021-05-25 NOTE — ED Notes (Signed)
Patient eating breakfast. °

## 2021-05-25 NOTE — ED Notes (Signed)
Calvin Sanchez slept most of night after coming to get his 2200 medications at 2310. Attempt COWS at that time but pt refused to participate stating " I'm sleeping ".  Awake at 0610 very focused on making phone call and on being discharged. He stating to this writer " I need to get out of here right now" after hanging up from a phone call. " I got people waiting for me I need to go right now".

## 2021-06-07 ENCOUNTER — Emergency Department (HOSPITAL_COMMUNITY)
Admission: EM | Admit: 2021-06-07 | Discharge: 2021-06-07 | Disposition: A | Discharge status: Home / Self Care | Payer: Medicaid Other | Attending: Emergency Medicine | Admitting: Emergency Medicine

## 2021-06-07 ENCOUNTER — Encounter (HOSPITAL_COMMUNITY): Payer: Self-pay

## 2021-06-07 DIAGNOSIS — Z5900 Homelessness unspecified: Secondary | ICD-10-CM | POA: Insufficient documentation

## 2021-06-07 DIAGNOSIS — M79671 Pain in right foot: Secondary | ICD-10-CM | POA: Insufficient documentation

## 2021-06-07 DIAGNOSIS — F17290 Nicotine dependence, other tobacco product, uncomplicated: Secondary | ICD-10-CM | POA: Insufficient documentation

## 2021-06-07 DIAGNOSIS — M79672 Pain in left foot: Secondary | ICD-10-CM | POA: Insufficient documentation

## 2021-06-07 MED ORDER — ACETAMINOPHEN 500 MG PO TABS: 500.0000 mg | ORAL_TABLET | Freq: Four times a day (QID) | ORAL | 0 refills | Status: AC | PRN

## 2021-06-07 NOTE — ED Provider Notes (Signed)
[facility] DEPT Provider Note   CSN: [id number] Arrival date & time: 06/07/21  6010     History No chief complaint on file.   Calvin Sanchez is a 30 y.o. male.  The history is provided by the patient and medical records. No language interpreter was used.   30 year old male significant history of homelessness and polysubstance abuse who presents complaining of bilateral foot pain.  Patient report his shoes is not well fitting therefore he is having constant pain to both of his feet.  This is an ongoing issue.  He also request for some food to eat.  States that he is very sleepy.  He is homeless.  He denies any recent injury no fever.  No other complaint.  As far as the pain he described as an achy sensation, persistent, moderate in severity.  Past Medical History:  Diagnosis Date   Homeless    Substance abuse [facility])     Patient Active Problem List   Diagnosis Date Noted   Homelessness 03/26/2021   Substance induced mood disorder ([place]) 03/26/2021   Passive suicidal ideations 03/26/2021   Cellulitis of arm 12/29/2019   Polysubstance abuse ([place]) 12/29/2019   IVDU (intravenous drug user) 12/29/2019   Sepsis ([place]) 12/29/2019   Leukocytosis 12/29/2019   Thrombocytosis 12/29/2019   Abnormal LFTs 12/29/2019   Hyperglycemia 12/29/2019   Left arm cellulitis 12/29/2019    Past Surgical History:  Procedure Laterality Date   KNEE SURGERY Left        Family History  Problem Relation Age of Onset   Hypertension Mother     Social History   Tobacco Use   Smoking status: Every Day    Types: Cigars   Smokeless tobacco: Never  Vaping Use   Vaping Use: Never used  Substance Use Topics   Alcohol use: Yes    Comment: Once a week. Last drink: yesterday    Drug use: Yes    Types: Cocaine, Methamphetamines    Comment: heroin    Home Medications Prior to Admission medications   Medication Sig Start Date End Date Taking? Authorizing Provider   cloNIDine (CATAPRES) 0.1 MG tablet Take 1 pill  at 10 AM on 9/16 , 1 pill at 10 PM on 9/16 , followed by  one pill at 10 AM 9/17 and one pill 10 PM on 9/17, followed by one pill at 10 AM on 9/18 05/25/21   Ival Bible, MD    Allergies    Patient has no known allergies.  Review of Systems   Review of Systems  Constitutional:  Negative for fever.  Skin:  Negative for wound.  Neurological:  Negative for numbness.   Physical Exam Updated Vital Signs BP (!) 146/91 (BP Location: Left Arm)   Pulse 94   Temp 98.4 F (36.9 C) (Oral)   Resp 16   Ht 6' (1.829 m)   Wt 68.9 kg   SpO2 100%   BMI 20.61 kg/m   Physical Exam Vitals and nursing note reviewed.  Constitutional:      General: He is not in acute distress.    Appearance: He is well-developed.     Comments: Patient was sleeping in the chair, easily arousable appears to be in no acute discomfort.  HENT:     Head: Atraumatic.  Eyes:     Conjunctiva/sclera: Conjunctivae normal.  Musculoskeletal:     Cervical back: Neck supple.     Comments: Feet, bilateral: Moderate amount of callus is  noted to the soles of each foot but otherwise no signs of trauma and no signs of skin infection.  No abscess.  Dorsalis pedis pulse palpable.  Skin:    Findings: No rash.    ED Results / Procedures / Treatments   Labs (all labs ordered are listed, but only abnormal results are displayed) Labs Reviewed - No data to display  EKG None  Radiology No results found.  Procedures Procedures   Medications Ordered in ED Medications - No data to display  ED Course  I have reviewed the triage vital signs and the nursing notes.  Pertinent labs & imaging results that were available during my care of the patient were reviewed by me and considered in my medical decision making (see chart for details).    MDM Rules/Calculators/A&P                           BP (!) 146/91 (BP Location: Left Arm)   Pulse 94   Temp 98.4 F (36.9 C)  (Oral)   Resp 16   Ht 6' (1.829 m)   Wt 68.9 kg   SpO2 100%   BMI 20.61 kg/m   Final Clinical Impression(s) / ED Diagnoses Final diagnoses:  None    Rx / DC Orders ED Discharge Orders     None      10:36 AM Patient is homeless, he is here complaining of foot pain bilaterally from ill fitting shoes.  No evidence of infection no recent injury.  He also requesting for food to eat.  I will provide patient with socks, and outpatient resource but otherwise patient is stable for discharge.  Tylenol as needed for pain.   Domenic Moras, PA-C 06/07/21 1042    Daleen Bo, MD 06/07/21 [phone]

## 2021-06-07 NOTE — ED Triage Notes (Signed)
Pt [place] the [place] complaining of bilateral foot pain with blisters, he says that his shoes are too small and he doesn't have others

## 2021-06-07 NOTE — ED Triage Notes (Signed)
EMS also says that the patient slept all the way here and was able to walk to the ambulance

## 2021-06-07 NOTE — Discharge Instructions (Signed)
Your foot pain is likely due to ill fitting shoes.  Please attempt to find better fitting shoes for comfort.  Take Tylenol as needed for pain.  Keep your feet dry to decrease risk of skin breakdown.

## 2021-07-01 ENCOUNTER — Ambulatory Visit (HOSPITAL_COMMUNITY)
Admission: EM | Admit: 2021-07-01 | Discharge: 2021-07-01 | Disposition: A | Discharge status: Home / Self Care | Payer: No Payment, Other

## 2021-07-01 DIAGNOSIS — F1110 Opioid abuse, uncomplicated: Secondary | ICD-10-CM

## 2021-07-01 NOTE — BH Assessment (Signed)
Calvin Sanchez, Routine, [id number]; 30 years old presents this date unaccompanied.  Pt reports that he is seeking rehab facility for drug problem.  Pt denies SI, HI, or AVH.  Pt admitted to prior H diagnosis; also, is not taking prescribed medication for symptom management.  Pt signed MSE.

## 2021-07-01 NOTE — Discharge Instructions (Signed)
Take all medications as prescribed. Keep all follow-up appointments as scheduled.  Do not consume alcohol or use illegal drugs while on prescription medications. Report any adverse effects from your medications to your primary care provider promptly.  In the event of recurrent symptoms or worsening symptoms, call 911, a crisis hotline, or go to the nearest emergency department for evaluation.   

## 2021-07-01 NOTE — ED Provider Notes (Addendum)
Behavioral Health Urgent Care Medical Screening Exam  Patient Name: Calvin Sanchez MRN: [id number] Date of Evaluation: 07/01/21 Chief Complaint:  " I need to get in to [place]"  Diagnosis:  Final diagnoses:  Opioid abuse ([place])    History of Present illness: Calvin Sanchez is a 30 y.o. male.  Presents to [facility] urgent care voluntary.  He is requesting resources for substance abuse long-term treatment facilities.  Reports a history of opiate dependency.  He reported last using/ abusing opioids one week ago.    Calvin Sanchez states he was in residential treatment 2 years ago.  Denied that he is currently followed by therapy or psychiatry.  States he was prescribed Cymbalta and Remeron in the past however has not followed up.   Calvin Sanchez is denying suicidal or homicidal ideations.  Denies auditory or visual hallucinations.  Patient was offered facility based crisis inpatient admission however he declined.  NP will provide additional outpatient resources for patient to follow-up.  Support, encouragement and  reassurance was provided.   Calvin Sanchez, 30 y.o., male patient seen face to face by this provider and chart reviewed on 07/01/21.  On evaluation Calvin Sanchez  During evaluation Calvin Sanchez is sitting in no acute distress.  He  is alert/oriented x 4; calm/cooperative; and mood congruent with affect. He  is speaking in a clear tone at moderate volume, and normal pace; with good eye contact. His thought process is coherent and relevant; There is no indication that he is currently responding to internal/external stimuli or experiencing delusional thought content; and he has denied suicidal/self-harm/homicidal ideation, psychosis, and paranoia.   Patient has remained calm throughout assessment and has answered questions appropriately.     At this time Calvin Sanchez is educated and verbalizes understanding of mental health resources and other crisis services in the  community. He is instructed to call 911 and present to the nearest emergency room should he experience any suicidal/homicidal ideation, auditory/visual/hallucinations, or detrimental worsening of his mental health condition. He was a also advised by Probation officer that he could call the toll-free phone on insurance card to assist with identifying in network counselors and agencies or number on back of Medicaid card to speak with care coordinator    Psychiatric Specialty Exam  Presentation  General Appearance:Appropriate for Environment  Eye Contact:Good  Speech:Clear and Coherent  Speech Volume:Normal  Handedness:Right   Mood and Affect  Mood:Anxious; Depressed  Affect:Congruent   Thought Process  Thought Processes:Coherent  Descriptions of Associations:Intact  Orientation:Full (Time, Place and Person)  Thought Content:Logical  Diagnosis of Schizophrenia or Schizoaffective disorder in past: No   Hallucinations:None  Ideas of Reference:None  Suicidal Thoughts:No Without Intent; Without Plan  Homicidal Thoughts:No   Sensorium  Memory:Immediate Good; Recent Good; Remote Good  Judgment:Good  Insight:Good   Executive Functions  Concentration:Fair  Attention Span:Good  Recall:Good  Fund of Knowledge:Good  Language:Good   Psychomotor Activity  Psychomotor Activity:Normal   Assets  Assets:Desire for Improvement; Intimacy   Sleep  Sleep:Fair  Number of hours: No data recorded  Nutritional Assessment (For OBS and [facility]BC admissions only) Has the patient had a weight loss or gain of 10 pounds or more in the last 3 months?: No Has the patient had a decrease in food intake/or appetite?: No Does the patient have dental problems?: No Does the patient have eating habits or behaviors that may be indicators of an eating disorder including binging or inducing vomiting?: No Has the patient recently lost  weight without trying?: 0 Has the patient been eating poorly  because of a decreased appetite?: 0 Malnutrition Screening Tool Score: 0   Physical Exam: Physical Exam Vitals and nursing note reviewed.  Cardiovascular:     Rate and Rhythm: Normal rate.  Pulmonary:     Effort: Pulmonary effort is normal.  Neurological:     Mental Status: He is alert and oriented to person, place, and time.  Psychiatric:        Attention and Perception: Attention normal.        Mood and Affect: Mood normal.        Speech: Speech normal.        Behavior: Behavior normal.        Thought Content: Thought content normal.        Cognition and Memory: Cognition normal.        Judgment: Judgment normal.   Review of Systems  Constitutional: Negative.   Respiratory: Negative.    Genitourinary: Negative.   Musculoskeletal:        Myoclonus  Endo/Heme/Allergies: Negative.   Psychiatric/Behavioral:  Positive for depression and substance abuse. Negative for suicidal ideas. The patient is nervous/anxious.   All other systems reviewed and are negative. Blood pressure (!) 156/105, pulse (!) 106, temperature 98.7 F (37.1 C), temperature source Oral, resp. rate 18, SpO2 98 %. There is no height or weight on file to calculate BMI.  Musculoskeletal: Strength & Muscle Tone: within normal limits Gait & Station: normal Patient leans: N/A   [place] [facility] Discharge Disposition for Follow up and Recommendations: Based on my evaluation the patient does not appear to have an emergency medical condition and can be discharged with resources and follow up care in outpatient services for Substance Abuse Intensive Outpatient Program  Patient was offered inpatient admission at the [facility] however he declined  D[place]ill [place], NP 07/01/2021, 8:56 AM

## 2021-07-01 NOTE — Progress Notes (Signed)
Calvin Sanchez received his AVS, questions answered and discharged without incident.

## 2021-07-05 ENCOUNTER — Telehealth (HOSPITAL_COMMUNITY): Payer: Self-pay | Admitting: Emergency Medicine

## 2021-07-05 NOTE — BH Assessment (Signed)
Care Management - Follow Up [facility] Discharges   Writer attempted to make contact with patient today and was unsuccessful.  Writer left a HIPPA compliant voice message.   

## 2021-09-23 ENCOUNTER — Other Ambulatory Visit: Payer: Self-pay

## 2021-09-23 ENCOUNTER — Encounter (HOSPITAL_COMMUNITY): Payer: Self-pay

## 2021-09-23 ENCOUNTER — Encounter (HOSPITAL_COMMUNITY): Payer: Self-pay | Admitting: *Deleted

## 2021-09-23 ENCOUNTER — Emergency Department (HOSPITAL_COMMUNITY)
Admission: EM | Admit: 2021-09-23 | Discharge: 2021-09-24 | Disposition: A | Discharge status: Home / Self Care | Payer: Medicaid Other | Attending: Emergency Medicine | Admitting: Emergency Medicine

## 2021-09-23 ENCOUNTER — Emergency Department (HOSPITAL_COMMUNITY)
Admission: EM | Admit: 2021-09-23 | Discharge: 2021-09-23 | Discharge status: Against Medical Advice | Payer: Medicaid Other | Attending: Emergency Medicine | Admitting: Emergency Medicine

## 2021-09-23 DIAGNOSIS — L0231 Cutaneous abscess of buttock: Secondary | ICD-10-CM | POA: Insufficient documentation

## 2021-09-23 DIAGNOSIS — R000 Tachycardia, unspecified: Secondary | ICD-10-CM | POA: Insufficient documentation

## 2021-09-23 DIAGNOSIS — Z5321 Procedure and treatment not carried out due to patient leaving prior to being seen by health care provider: Secondary | ICD-10-CM | POA: Insufficient documentation

## 2021-09-23 DIAGNOSIS — R7401 Elevation of levels of liver transaminase levels: Secondary | ICD-10-CM | POA: Insufficient documentation

## 2021-09-23 LAB — CBC WITH DIFFERENTIAL/PLATELET
Abs Immature Granulocytes: 0.05 10*3/uL (ref 0.00–0.07)
Basophils Absolute: 0.1 10*3/uL (ref 0.0–0.1)
Basophils Relative: 1 %
Eosinophils Absolute: 0.2 10*3/uL (ref 0.0–0.5)
Eosinophils Relative: 2 %
HCT: 42.6 % (ref 39.0–52.0)
Hemoglobin: 13.7 g/dL (ref 13.0–17.0)
Immature Granulocytes: 0 %
Lymphocytes Relative: 25 %
Lymphs Abs: 3.3 10*3/uL (ref 0.7–4.0)
MCH: 28.2 pg (ref 26.0–34.0)
MCHC: 32.2 g/dL (ref 30.0–36.0)
MCV: 87.7 fL (ref 80.0–100.0)
Monocytes Absolute: 1.2 10*3/uL — ABNORMAL HIGH (ref 0.1–1.0)
Monocytes Relative: 10 %
Neutro Abs: 8.1 10*3/uL — ABNORMAL HIGH (ref 1.7–7.7)
Neutrophils Relative %: 62 %
Platelets: 463 10*3/uL — ABNORMAL HIGH (ref 150–400)
RBC: 4.86 MIL/uL (ref 4.22–5.81)
RDW: 12.4 % (ref 11.5–15.5)
WBC: 12.9 10*3/uL — ABNORMAL HIGH (ref 4.0–10.5)
nRBC: 0 % (ref 0.0–0.2)

## 2021-09-23 MED ORDER — HYDROCODONE-ACETAMINOPHEN 5-325 MG PO TABS
2.0000 | ORAL_TABLET | Freq: Once | ORAL | Status: AC
  Administered 2021-09-23: 2 via ORAL
  Filled 2021-09-23: qty 2

## 2021-09-23 NOTE — ED Provider Triage Note (Signed)
Emergency Medicine Provider Triage Evaluation Note  Calvin Sanchez , a 31 y.o. male  was evaluated in triage.  Pt complains of abscess.  He states that he noticed a bump on his backside.  States that it has worsened over the past day.  States that it drained some purulent fluid.  Denies hx of the same.  Denies any treatment PTA.  Review of Systems  Positive: Abscess, pain Negative: Fever, chills  Physical Exam  BP (!) 149/95 (BP Location: Left Arm)    Pulse (!) 122    Temp 98.6 F (37 C) (Oral)    Resp 18    SpO2 98%  Gen:   Awake, no distress   Resp:  Normal effort  MSK:   Moves extremities without difficulty  Other:  Abscess and cellulitis to the right buttock  Medical Decision Making  Medically screening exam initiated at 2:35 AM.  Appropriate orders placed.  Calvin Sanchez was informed that the remainder of the evaluation will be completed by another provider, this initial triage assessment does not replace that evaluation, and the importance of remaining in the ED until their evaluation is complete.  abscess   Calvin Circle, PA-C 09/23/21 4742

## 2021-09-23 NOTE — ED Triage Notes (Signed)
Pt reports with abscess, fever, and tachycardia x 7 days. Pt states that he has an abscess to his right buttock that needs to be lanced, pt left [facility] AMA because he had to work last night.

## 2021-09-23 NOTE — ED Notes (Signed)
Patient left without being seen due to wait times

## 2021-09-23 NOTE — ED Triage Notes (Signed)
Pt reports abscess on the right buttocks for about 1 week, did have some drainage yesterday. Denies fevers.

## 2021-09-24 LAB — COMPREHENSIVE METABOLIC PANEL
ALT: 229 U/L — ABNORMAL HIGH (ref 0–44)
AST: 165 U/L — ABNORMAL HIGH (ref 15–41)
Albumin: 4.2 g/dL (ref 3.5–5.0)
Alkaline Phosphatase: 118 U/L (ref 38–126)
Anion gap: 8 (ref 5–15)
BUN: 17 mg/dL (ref 6–20)
CO2: 29 mmol/L (ref 22–32)
Calcium: 9.7 mg/dL (ref 8.9–10.3)
Chloride: 100 mmol/L (ref 98–111)
Creatinine, Ser: 0.85 mg/dL (ref 0.61–1.24)
GFR, Estimated: >60 mL/min (ref >60)
Glucose, Bld: 105 mg/dL — ABNORMAL HIGH (ref 70–99)
Potassium: 3.9 mmol/L (ref 3.5–5.1)
Sodium: 137 mmol/L (ref 135–145)
Total Bilirubin: 0.9 mg/dL (ref 0.3–1.2)
Total Protein: 8.8 g/dL — ABNORMAL HIGH (ref 6.5–8.1)

## 2021-09-24 LAB — HEPATITIS PANEL, ACUTE
HCV Ab: REACTIVE — AB
Hep A IgM: NONREACTIVE
Hep B C IgM: NONREACTIVE
Hepatitis B Surface Ag: NONREACTIVE

## 2021-09-24 MED ORDER — SODIUM CHLORIDE 0.9 % IV BOLUS
1000.0000 mL | Freq: Once | INTRAVENOUS | Status: AC
  Administered 2021-09-24: 1000 mL via INTRAVENOUS

## 2021-09-24 MED ORDER — SULFAMETHOXAZOLE-TRIMETHOPRIM 800-160 MG PO TABS: 1.0000 | ORAL_TABLET | Freq: Two times a day (BID) | ORAL | 0 refills | Status: AC

## 2021-09-24 MED ORDER — SODIUM CHLORIDE 0.9 % IV BOLUS
500.0000 mL | Freq: Once | INTRAVENOUS | Status: AC
  Administered 2021-09-24: 500 mL via INTRAVENOUS

## 2021-09-24 MED ORDER — LIDOCAINE-EPINEPHRINE (PF) 2 %-1:200000 IJ SOLN
10.0000 mL | Freq: Once | INTRAMUSCULAR | Status: AC
  Administered 2021-09-24: 10 mL
  Filled 2021-09-24: qty 20

## 2021-09-24 NOTE — ED Notes (Signed)
Incision and Drainage kit at bedside.  

## 2021-09-24 NOTE — Discharge Instructions (Addendum)
Have your wound rechecked in 48 hours at which time your packing should be removed from the site.  Apply warm compresses at least 3 times a day to promote further drainage. Keep the area covered with a bandage. Change this at least once per day to keep the area clean and dry. We recommend Bactrim to prevent further infection as well as ibuprofen for pain. You may return to the emergency department for new or concerning symptoms.

## 2021-09-24 NOTE — ED Provider Notes (Addendum)
[place] DEPT Provider Note   CSN: [id number] Arrival date & time: 09/23/21  2224     History  Chief Complaint  Patient presents with   Abscess   Fever   Tachycardia    Calvin Sanchez is a 31 y.o. male.  31 year old male with a history of substance abuse presents to the emergency department for evaluation of a right gluteal abscess x8 days.  Reports that area has been worsening and becoming more painful.  He did have some spontaneous drainage from the site yesterday. No medications PTA. Endorses a subjective fever. Denies any drug or ETOH use in the past week other than a Norco he was given in triage yesterday  The history is provided by the patient. No language interpreter was used.  Abscess Associated symptoms: fever   Fever     Home Medications Prior to Admission medications   Medication Sig Start Date End Date Taking? Authorizing Provider  sulfamethoxazole-trimethoprim (BACTRIM DS) 800-160 MG tablet Take 1 tablet by mouth 2 (two) times daily for 7 days. 09/24/21 10/01/21 Yes Antonietta Breach, PA-C  acetaminophen (TYLENOL) 500 MG tablet Take 1 tablet (500 mg total) by mouth every 6 (six) hours as needed. 06/07/21   Domenic Moras, PA-C  cloNIDine (CATAPRES) 0.1 MG tablet Take 1 pill  at 10 AM on 9/16 , 1 pill at 10 PM on 9/16 , followed by  one pill at 10 AM 9/17 and one pill 10 PM on 9/17, followed by one pill at 10 AM on 9/18 05/25/21   Ival Bible, MD      Allergies    Patient has no known allergies.    Review of Systems   Review of Systems  Constitutional:  Positive for fever.  Ten systems reviewed and are negative for acute change, except as noted in the HPI.    Physical Exam Updated Vital Signs BP 133/70 (BP Location: Left Arm)    Pulse 90    Temp 98.6 F (37 C) (Oral)    Resp 16    SpO2 100%  Physical Exam Vitals and nursing note reviewed.  Constitutional:      General: He is not in acute distress.    Appearance: He is  well-developed. He is not diaphoretic.     Comments: Nontoxic appearing  HENT:     Head: Normocephalic and atraumatic.  Eyes:     General: No scleral icterus.    Conjunctiva/sclera: Conjunctivae normal.  Cardiovascular:     Rate and Rhythm: Regular rhythm. Tachycardia present.     Pulses: Normal pulses.  Pulmonary:     Effort: Pulmonary effort is normal. No respiratory distress.  Genitourinary:      Comments: Area of induration and erythema to the right gluteus. No active drainage or distinct fluctuance. No extension to the perineum.  Musculoskeletal:        General: Normal range of motion.     Cervical back: Normal range of motion.  Skin:    General: Skin is warm and dry.     Coloration: Skin is not pale.     Findings: No erythema or rash.  Neurological:     Mental Status: He is alert and oriented to person, place, and time.  Psychiatric:        Behavior: Behavior normal.    ED Results / Procedures / Treatments   Labs (all labs ordered are listed, but only abnormal results are displayed) Labs Reviewed  CBC WITH DIFFERENTIAL/PLATELET - Abnormal; Notable  for the following components:      Result Value   WBC 12.9 (*)    Platelets 463 (*)    Neutro Abs 8.1 (*)    Monocytes Absolute 1.2 (*)    All other components within normal limits  COMPREHENSIVE METABOLIC PANEL - Abnormal; Notable for the following components:   Glucose, Bld 105 (*)    Total Protein 8.8 (*)    AST 165 (*)    ALT 229 (*)    All other components within normal limits  HEPATITIS PANEL, ACUTE    EKG None  Radiology No results found.  Procedures Procedures   INCISION AND DRAINAGE Performed by: Antonietta Breach Consent: Verbal consent obtained. Risks and benefits: risks, benefits and alternatives were discussed Type: abscess  Body area: R gluteal  Anesthesia: local infiltration  Incision was made with a scalpel.  Local anesthetic: lidocaine 2% with epinephrine  Anesthetic total: 6  ml  Complexity: complex Blunt dissection to break up loculations  Drainage: purulent and bloody  Drainage amount: moderate  Packing material: 1/4 in iodoform gauze  Patient tolerance: Patient tolerated the procedure well with no immediate complications.   Medications Ordered in ED Medications  sodium chloride 0.9 % bolus 1,000 mL (0 mLs Intravenous Stopped 09/24/21 0143)  lidocaine-EPINEPHrine (XYLOCAINE W/EPI) 2 %-1:200000 (PF) injection 10 mL (10 mLs Infiltration Given 09/24/21 0027)  sodium chloride 0.9 % bolus 500 mL (0 mLs Intravenous Stopped 09/24/21 0219)    ED Course/ Medical Decision Making/ A&P Clinical Course as of 09/24/21 0255  Mon Sep 24, 2021  0254 Tachycardia has resolved with IV fluids.  Suggestive of a degree of dehydration which may be attributable to history of homelessness; decreased PO intake. [KH]    Clinical Course User Index [KH] Antonietta Breach, PA-C                           Medical Decision Making  Patient with skin abscess amenable to incision and drainage.  Abscess packed with iodoform gauze. Wound recheck in 2 days. Encouraged home warm soaks and flushing.  Mild signs of cellulitis is surrounding skin.  Will d/c to home with Rx for Bactrim.  Patient also noted to have transaminitis.  This is fairly chronic and has been trending up since at least June.  Given his history of polysubstance abuse, hepatitis panel ordered.  This can be followed by a primary care doctor as an outpatient.  Referral given to [facility].  This patient presents to the ED for concern of right gluteal pain and swelling, this involves an extensive number of treatment options, and is a complaint that carries with it a high risk of complications and morbidity.  The differential diagnosis includes cellulitis vs abscess vs mass   Co morbidities that complicate the patient evaluation  Homelessness Polysubstance abuse   Lab Tests:  I Ordered, and personally  interpreted labs.  The pertinent results include:  Leukocytosis which has been present in the past. No left shift; less likely related to infection. May be attributable to polysubstance use. Worsening transaminitis which has been chronic and up-trending since at least May 2022. Acute hepatitis panel added.   Cardiac Monitoring:  The patient was maintained on a cardiac monitor.  I personally viewed and interpreted the cardiac monitored which showed an underlying rhythm of: sinus tachycardia   Medicines ordered and prescription drug management:  I ordered medication including IVF for tachycardia, presumed dehydration. Given local  injection of Lidocaine prior to I&D  Reevaluation of the patient after these medicines showed that the patient improved I have reviewed the patients home medicines and have made adjustments as needed   Problem List / ED Course:  Exam and hx c/w gluteal abscess   Reevaluation:  After the interventions noted above, I reevaluated the patient and found that they have :improved   Social Determinants of Health:  Homelessness    Dispostion:  After consideration of the diagnostic results and the patients response to treatment, I feel that the patent would benefit from wound recheck in 2 days. Patient to have packing removed at this visit. Referred to PCP for recheck of LFTs. Counseled against drug use.    Vitals:   09/24/21 0055 09/24/21 0100 09/24/21 0130 09/24/21 0158  BP: 128/84  137/71 137/71  Pulse: (!) 118 (!) 116 96 96  Resp: (!) 23  20 17   Temp:      TempSrc:      SpO2: 96% 96% 100% 100%     Final Clinical Impression(s) / ED Diagnoses Final diagnoses:  Gluteal abscess  Transaminitis    Rx / DC Orders ED Discharge Orders          Ordered    sulfamethoxazole-trimethoprim (BACTRIM DS) 800-160 MG tablet  2 times daily        09/24/21 0211              Antonietta Breach, PA-C 09/24/21 0259    Antonietta Breach, PA-C 09/24/21 0301     Quintella Reichert, MD 09/24/21 [phone]

## 2021-10-03 ENCOUNTER — Emergency Department (HOSPITAL_COMMUNITY)
Admission: EM | Admit: 2021-10-03 | Discharge: 2021-10-03 | Disposition: A | Discharge status: Home / Self Care | Payer: Self-pay | Attending: Emergency Medicine | Admitting: Emergency Medicine

## 2021-10-03 ENCOUNTER — Emergency Department (HOSPITAL_COMMUNITY): Payer: Self-pay

## 2021-10-03 ENCOUNTER — Encounter (HOSPITAL_COMMUNITY): Payer: Self-pay | Admitting: Emergency Medicine

## 2021-10-03 ENCOUNTER — Other Ambulatory Visit: Payer: Self-pay

## 2021-10-03 DIAGNOSIS — L0231 Cutaneous abscess of buttock: Secondary | ICD-10-CM | POA: Insufficient documentation

## 2021-10-03 LAB — CBC WITH DIFFERENTIAL/PLATELET
Abs Immature Granulocytes: 0.08 10*3/uL — ABNORMAL HIGH (ref 0.00–0.07)
Basophils Absolute: 0.1 10*3/uL (ref 0.0–0.1)
Basophils Relative: 0 %
Eosinophils Absolute: 0.3 10*3/uL (ref 0.0–0.5)
Eosinophils Relative: 2 %
HCT: 38.1 % — ABNORMAL LOW (ref 39.0–52.0)
Hemoglobin: 12.7 g/dL — ABNORMAL LOW (ref 13.0–17.0)
Immature Granulocytes: 1 %
Lymphocytes Relative: 28 %
Lymphs Abs: 4.8 10*3/uL — ABNORMAL HIGH (ref 0.7–4.0)
MCH: 28.7 pg (ref 26.0–34.0)
MCHC: 33.3 g/dL (ref 30.0–36.0)
MCV: 86.2 fL (ref 80.0–100.0)
Monocytes Absolute: 1.7 10*3/uL — ABNORMAL HIGH (ref 0.1–1.0)
Monocytes Relative: 10 %
Neutro Abs: 10.4 10*3/uL — ABNORMAL HIGH (ref 1.7–7.7)
Neutrophils Relative %: 59 %
Platelets: 383 10*3/uL (ref 150–400)
RBC: 4.42 MIL/uL (ref 4.22–5.81)
RDW: 12.3 % (ref 11.5–15.5)
WBC: 17.3 10*3/uL — ABNORMAL HIGH (ref 4.0–10.5)
nRBC: 0 % (ref 0.0–0.2)

## 2021-10-03 LAB — BASIC METABOLIC PANEL
Anion gap: 7 (ref 5–15)
BUN: 12 mg/dL (ref 6–20)
CO2: 25 mmol/L (ref 22–32)
Calcium: 9.1 mg/dL (ref 8.9–10.3)
Chloride: 100 mmol/L (ref 98–111)
Creatinine, Ser: 0.83 mg/dL (ref 0.61–1.24)
GFR, Estimated: >60 mL/min (ref >60)
Glucose, Bld: 100 mg/dL — ABNORMAL HIGH (ref 70–99)
Potassium: 4.4 mmol/L (ref 3.5–5.1)
Sodium: 132 mmol/L — ABNORMAL LOW (ref 135–145)

## 2021-10-03 MED ORDER — VANCOMYCIN HCL IN DEXTROSE 1-5 GM/200ML-% IV SOLN
1000.0000 mg | Freq: Once | INTRAVENOUS | Status: AC
  Administered 2021-10-03: 20:00:00 1000 mg via INTRAVENOUS
  Filled 2021-10-03: qty 200

## 2021-10-03 MED ORDER — KETAMINE HCL 50 MG/5ML IJ SOSY: 0.3000 mg/kg | PREFILLED_SYRINGE | Freq: Once | INTRAMUSCULAR | Status: DC

## 2021-10-03 MED ORDER — SODIUM CHLORIDE 0.9 % IV BOLUS
1000.0000 mL | Freq: Once | INTRAVENOUS | Status: AC
  Administered 2021-10-03: 20:00:00 1000 mL via INTRAVENOUS

## 2021-10-03 MED ORDER — IOHEXOL 300 MG/ML  SOLN
100.0000 mL | Freq: Once | INTRAMUSCULAR | Status: AC | PRN
  Administered 2021-10-03: 21:00:00 100 mL via INTRAVENOUS

## 2021-10-03 MED ORDER — CLINDAMYCIN HCL 300 MG PO CAPS: 300.0000 mg | ORAL_CAPSULE | Freq: Three times a day (TID) | ORAL | 0 refills | Status: AC

## 2021-10-03 MED ORDER — CLINDAMYCIN HCL 300 MG PO CAPS: 300.0000 mg | ORAL_CAPSULE | Freq: Three times a day (TID) | ORAL | 0 refills | Status: DC

## 2021-10-03 MED ORDER — LIDOCAINE HCL 2 % IJ SOLN
10.0000 mL | Freq: Once | INTRAMUSCULAR | Status: AC
  Administered 2021-10-03: 20:00:00 200 mg
  Filled 2021-10-03: qty 20

## 2021-10-03 MED ORDER — PIPERACILLIN-TAZOBACTAM 3.375 G IVPB 30 MIN
3.3750 g | Freq: Once | INTRAVENOUS | Status: AC
  Administered 2021-10-03: 20:00:00 3.375 g via INTRAVENOUS
  Filled 2021-10-03: qty 50

## 2021-10-03 MED ORDER — HYDROCODONE-ACETAMINOPHEN 5-325 MG PO TABS: 1.0000 | ORAL_TABLET | Freq: Four times a day (QID) | ORAL | 0 refills | Status: AC | PRN

## 2021-10-03 MED ORDER — ACETAMINOPHEN 325 MG PO TABS: 650.0000 mg | ORAL_TABLET | Freq: Four times a day (QID) | ORAL | Status: DC | PRN

## 2021-10-03 MED ORDER — HYDROCODONE-ACETAMINOPHEN 5-325 MG PO TABS: 1.0000 | ORAL_TABLET | Freq: Four times a day (QID) | ORAL | 0 refills | Status: DC | PRN

## 2021-10-03 MED ORDER — HYDROMORPHONE HCL 1 MG/ML IJ SOLN
1.0000 mg | Freq: Once | INTRAMUSCULAR | Status: AC
  Administered 2021-10-03: 20:00:00 1 mg via INTRAVENOUS
  Filled 2021-10-03: qty 1

## 2021-10-03 NOTE — ED Provider Notes (Signed)
[facility] EMERGENCY DEPARTMENT Provider Note   CSN: [id number] Arrival date & time: 10/03/21  0147     History  Chief Complaint  Patient presents with   Skin Abscess    Buttocks    Calvin Sanchez is a very pleasant 31 y.o. male with a history of IV drug use who presents emergency department with chief complaint of left-sided buttock abscess.  Patient has had several days of worsening pain and swelling on the left buttocks.  He states that over the past 24 hours he has developed pain internally in the rectum especially if he tries to pass gas or make a bowel movement.  He was seen recently for an abscess on the right side of his buttock.  Patient states that he is actively trying to get clean and occasionally slips up however is currently employed and states that he "finally sees a light out of the tunnel."  He declines opiate pain medications at this time.  We will treat with ketamine for analgesia HPI     Home Medications Prior to Admission medications   Medication Sig Start Date End Date Taking? Authorizing Provider  acetaminophen (TYLENOL) 500 MG tablet Take 1 tablet (500 mg total) by mouth every 6 (six) hours as needed. 06/07/21   Domenic Moras, PA-C  cloNIDine (CATAPRES) 0.1 MG tablet Take 1 pill  at 10 AM on 9/16 , 1 pill at 10 PM on 9/16 , followed by  one pill at 10 AM 9/17 and one pill 10 PM on 9/17, followed by one pill at 10 AM on 9/18 05/25/21   Ival Bible, MD      Allergies    Patient has no known allergies.    Review of Systems   Review of Systems  Physical Exam Updated Vital Signs BP (!) 130/91 (BP Location: Left Arm)    Pulse (!) 113    Temp 98.8 F (37.1 C) (Oral)    Resp 18    Ht 6' (1.829 m)    Wt 76.2 kg    SpO2 97%    BMI 22.78 kg/m  Physical Exam Vitals and nursing note reviewed.  Constitutional:      General: He is not in acute distress.    Appearance: He is well-developed. He is not diaphoretic.  HENT:     Head:  Normocephalic and atraumatic.  Eyes:     General: No scleral icterus.    Extraocular Movements: Extraocular movements intact.     Conjunctiva/sclera: Conjunctivae normal.     Pupils: Pupils are equal, round, and reactive to light.  Cardiovascular:     Rate and Rhythm: Normal rate and regular rhythm.     Heart sounds: Normal heart sounds.  Pulmonary:     Effort: Pulmonary effort is normal. No respiratory distress.     Breath sounds: Normal breath sounds.  Abdominal:     Palpations: Abdomen is soft.     Tenderness: There is no abdominal tenderness.  Musculoskeletal:     Cervical back: Normal range of motion and neck supple.     Comments: Right gluteus with well-healing recent incisional scar.  Left gluteus with significant swelling and erythema, central crusting, exquisitely tender to palpation.  Unable to tolerate left rectal examination.  Skin:    General: Skin is warm and dry.  Neurological:     Mental Status: He is alert.  Psychiatric:        Behavior: Behavior normal.    ED Results / Procedures /  Treatments   Labs (all labs ordered are listed, but only abnormal results are displayed) Labs Reviewed  CBC WITH DIFFERENTIAL/PLATELET - Abnormal; Notable for the following components:      Result Value   WBC 17.3 (*)    Hemoglobin 12.7 (*)    HCT 38.1 (*)    Neutro Abs 10.4 (*)    Lymphs Abs 4.8 (*)    Monocytes Absolute 1.7 (*)    Abs Immature Granulocytes 0.08 (*)    All other components within normal limits  BASIC METABOLIC PANEL - Abnormal; Notable for the following components:   Sodium 132 (*)    Glucose, Bld 100 (*)    All other components within normal limits    EKG None  Radiology No results found.  Procedures Procedures    Medications Ordered in ED Medications  acetaminophen (TYLENOL) tablet 650 mg (has no administration in time range)    ED Course/ Medical Decision Making/ A&P                           Medical Decision Making Amount and/or  Complexity of Data Reviewed Radiology: ordered.  Risk Prescription drug management.   Patient here with recurrent abscess of buttocks. I ordered a ct of the pelvis to rule out perirectal abscess.  Labs reviewed show leukocytosis- patient initially tachycardic, improving. Sign out at shift change to Dr. Darl Householder Final Clinical Impression(s) / ED Diagnoses Final diagnoses:  None    Rx / DC Orders ED Discharge Orders     None         Margarita Mail, PA-C 10/04/21 1122    Drenda Freeze, MD 10/04/21 [phone]

## 2021-10-03 NOTE — Care Management (Signed)
°  [place] Medication Assistance Card Name: Calvin Sanchez ID (MRN): [id number] [facility]: [id number] RX Group: [id number] Discharge Date: 10/03/2021 Expiration Date:  10/13/2021                                           (must be filled within 7 days of discharge)      Dear   : Calvin Sanchez  You have been approved to have the prescriptions written by your discharging physician filled through our [facility] (Medication Assistance Through [facility]) program. This program allows for a one-time (no refills) 34-day supply of selected medications for a low copay amount.  The copay is $3.00 per prescription. For instance, if you have one prescription, you will pay $3.00; for two prescriptions, you pay $6.00; for three prescriptions, you pay $9.00; and so on.  Only certain pharmacies are participating in this program with [facility]. You will need to select one of the pharmacies from the attached list and take your prescriptions, this letter, and your photo ID to one of the participating pharmacies.   We are excited that you are able to use the [facility] program to get your medications. These prescriptions must be filled within 7 days of hospital discharge or they will no longer be valid for the [facility] program. Should you have any problems with your prescriptions please contact your case management team member at [phone] for [place]/ [place] you, [place] Management          ED CM faxed to [facility] on [address],

## 2021-10-03 NOTE — ED Notes (Signed)
Unable to locate patient.

## 2021-10-03 NOTE — Discharge Instructions (Signed)
You were given antibiotics in the ED.   Please take clindamycin 300 mg 3 times daily for a week  Please take Vicodin for severe pain.  You need to return in 2 days for wound check.  Return to ER earlier if you have severe pain, fever.

## 2021-10-03 NOTE — ED Provider Notes (Signed)
°  Physical Exam  BP (!) 99/59    Pulse 99    Temp 98.8 F (37.1 C) (Oral)    Resp 14    Ht 6' (1.829 m)    Wt 76.2 kg    SpO2 100%    BMI 22.78 kg/m   Physical Exam  Procedures  .Marland KitchenIncision and Drainage  Date/Time: 10/03/2021 9:44 PM Performed by: Drenda Freeze, MD Authorized by: Drenda Freeze, MD   Consent:    Consent obtained:  Verbal   Consent given by:  Patient   Risks discussed:  Bleeding, infection, incomplete drainage and pain   Alternatives discussed:  Alternative treatment, delayed treatment and observation Universal protocol:    Patient identity confirmed:  Verbally with patient Location:    Type:  Abscess Anesthesia:    Anesthesia method:  Local infiltration   Local anesthetic:  Lidocaine 1% w/o epi Procedure type:    Complexity:  Complex Procedure details:    Ultrasound guidance: no     Needle aspiration: no     Incision types:  Cruciate   Wound management:  Probed and deloculated   Drainage:  Purulent   Drainage amount:  Moderate   Wound treatment:  Wound left open   Packing materials:  None Post-procedure details:    Procedure completion:  Tolerated well, no immediate complications  ED Course / MDM    Medical Decision Making I provided a substantive portion of the care of this patient.  I personally performed the entirety of the history, exam, and medical decision making for this encounter.    Calvin Sanchez is a 31 y.o. male here presenting with recurrent buttock abscess.  Patient had right buttock abscess a week ago.  Patient was prescribed Bactrim but never filled it.  Patient now has left buttock swelling.  Clinically he has a abscess.  I was able to perform bedside ultrasound and there is seem to be an abscess there.  I was able to I&D the abscess.  CBC showed white count of 17.  CT did not show any signs of deep abscess or bowel involvement.  I consulted case management regarding filling his prescription.  We will send his prescription to  [facility].  Encouraged him to return in 2 days for wound check.     Amount and/or Complexity of Data Reviewed Labs: ordered. Decision-making details documented in ED Course. Radiology: ordered and independent interpretation performed. Decision-making details documented in ED Course.  Risk Prescription drug management.        Drenda Freeze, MD 10/03/21 [phone]

## 2021-10-03 NOTE — ED Triage Notes (Signed)
Patient reports multiple skin abscess at both side of buttocks onset 2 weeks ago with drainage .

## 2021-10-03 NOTE — ED Provider Triage Note (Signed)
Emergency Medicine Provider Triage Evaluation Note  EULISES KIJOWSKI , a 31 y.o. male  was evaluated in triage.  Pt complains of pain and swelling to bilateral buttocks. IVDU hx. Seen 10 days ago and was tachycardic and initially tachycardic. This improved with fluids. He had I&D then.   He is requesting food and water.   Review of Systems  Positive: Wounds  Negative: Fever  Physical Exam  BP 115/80 (BP Location: Left Arm)    Pulse (!) 122    Temp 98.9 F (37.2 C) (Oral)    Resp 20    SpO2 93%  Gen:   Awake, no distress   Resp:  Normal effort  MSK:   Moves extremities without difficulty  Other:  R and L wounds concerning for abscess.   Medical Decision Making  Medically screening exam initiated at 2:18 AM.  Appropriate orders placed.  TADEO BESECKER was informed that the remainder of the evaluation will be completed by another provider, this initial triage assessment does not replace that evaluation, and the importance of remaining in the ED until their evaluation is complete.  Possible abscess  Labs ordered.   Tachycardia but no fever or tachypnea. Low suspicion for sepsis I suspect his tachycardia is multifactorial perhaps related to dehydration and poor food intake question whether there may be recreational drug use involved as well given his history of as documented in [place], [place], [place] 10/03/21 0221

## 2022-05-20 ENCOUNTER — Emergency Department (HOSPITAL_COMMUNITY)
Admission: EM | Admit: 2022-05-20 | Discharge: 2022-05-20 | Discharge status: Against Medical Advice | Payer: Self-pay | Attending: Emergency Medicine | Admitting: Emergency Medicine

## 2022-05-20 ENCOUNTER — Other Ambulatory Visit: Payer: Self-pay

## 2022-05-20 ENCOUNTER — Emergency Department (HOSPITAL_COMMUNITY): Payer: Self-pay

## 2022-05-20 ENCOUNTER — Encounter (HOSPITAL_COMMUNITY): Payer: Self-pay | Admitting: Emergency Medicine

## 2022-05-20 DIAGNOSIS — R944 Abnormal results of kidney function studies: Secondary | ICD-10-CM | POA: Insufficient documentation

## 2022-05-20 DIAGNOSIS — D72829 Elevated white blood cell count, unspecified: Secondary | ICD-10-CM | POA: Insufficient documentation

## 2022-05-20 DIAGNOSIS — E871 Hypo-osmolality and hyponatremia: Secondary | ICD-10-CM | POA: Insufficient documentation

## 2022-05-20 DIAGNOSIS — Z20822 Contact with and (suspected) exposure to covid-19: Secondary | ICD-10-CM | POA: Insufficient documentation

## 2022-05-20 DIAGNOSIS — E872 Acidosis, unspecified: Secondary | ICD-10-CM | POA: Insufficient documentation

## 2022-05-20 DIAGNOSIS — A419 Sepsis, unspecified organism: Secondary | ICD-10-CM | POA: Insufficient documentation

## 2022-05-20 DIAGNOSIS — F1990 Other psychoactive substance use, unspecified, uncomplicated: Secondary | ICD-10-CM | POA: Insufficient documentation

## 2022-05-20 DIAGNOSIS — Z79899 Other long term (current) drug therapy: Secondary | ICD-10-CM | POA: Insufficient documentation

## 2022-05-20 LAB — LACTIC ACID, PLASMA: Lactic Acid, Venous: 2.4 mmol/L (ref 0.5–1.9)

## 2022-05-20 LAB — RESP PANEL BY RT-PCR (FLU A&B, COVID) ARPGX2
Influenza A by PCR: NEGATIVE
Influenza B by PCR: NEGATIVE
SARS Coronavirus 2 by RT PCR: NEGATIVE

## 2022-05-20 LAB — URINALYSIS, ROUTINE W REFLEX MICROSCOPIC
Bilirubin Urine: NEGATIVE
Glucose, UA: NEGATIVE mg/dL
Hgb urine dipstick: NEGATIVE
Ketones, ur: NEGATIVE mg/dL
Leukocytes,Ua: NEGATIVE
Nitrite: NEGATIVE
Protein, ur: NEGATIVE mg/dL
Specific Gravity, Urine: 1.025 (ref 1.005–1.030)
pH: 5 (ref 5.0–8.0)

## 2022-05-20 LAB — CBG MONITORING, ED: Glucose-Capillary: 116 mg/dL — ABNORMAL HIGH (ref 70–99)

## 2022-05-20 LAB — BASIC METABOLIC PANEL
Anion gap: 11 (ref 5–15)
BUN: 22 mg/dL — ABNORMAL HIGH (ref 6–20)
CO2: 27 mmol/L (ref 22–32)
Calcium: 9.2 mg/dL (ref 8.9–10.3)
Chloride: 96 mmol/L — ABNORMAL LOW (ref 98–111)
Creatinine, Ser: 0.98 mg/dL (ref 0.61–1.24)
GFR, Estimated: >60 mL/min (ref >60)
Glucose, Bld: 92 mg/dL (ref 70–99)
Potassium: 4.1 mmol/L (ref 3.5–5.1)
Sodium: 134 mmol/L — ABNORMAL LOW (ref 135–145)

## 2022-05-20 LAB — CBC
HCT: 36.6 % — ABNORMAL LOW (ref 39.0–52.0)
Hemoglobin: 12.6 g/dL — ABNORMAL LOW (ref 13.0–17.0)
MCH: 29.4 pg (ref 26.0–34.0)
MCHC: 34.4 g/dL (ref 30.0–36.0)
MCV: 85.5 fL (ref 80.0–100.0)
Platelets: 317 10*3/uL (ref 150–400)
RBC: 4.28 MIL/uL (ref 4.22–5.81)
RDW: 12.2 % (ref 11.5–15.5)
WBC: 26.6 10*3/uL — ABNORMAL HIGH (ref 4.0–10.5)
nRBC: 0 % (ref 0.0–0.2)

## 2022-05-20 MED ORDER — CEFUROXIME AXETIL 500 MG PO TABS: 500.0000 mg | ORAL_TABLET | Freq: Two times a day (BID) | ORAL | 0 refills | Status: AC

## 2022-05-20 MED ORDER — LACTATED RINGERS IV BOLUS
2000.0000 mL | Freq: Once | INTRAVENOUS | Status: AC
  Administered 2022-05-20: 2000 mL via INTRAVENOUS

## 2022-05-20 MED ORDER — VANCOMYCIN HCL 1750 MG/350ML IV SOLN
1750.0000 mg | Freq: Once | INTRAVENOUS | Status: AC
Start: 2022-05-20 — End: 2022-05-20
  Administered 2022-05-20: 1750 mg via INTRAVENOUS
  Filled 2022-05-20: qty 350

## 2022-05-20 MED ORDER — VANCOMYCIN HCL 1250 MG/250ML IV SOLN: 1250.0000 mg | Freq: Two times a day (BID) | INTRAVENOUS | Status: DC

## 2022-05-20 MED ORDER — SODIUM CHLORIDE 0.9 % IV SOLN
2.0000 g | Freq: Three times a day (TID) | INTRAVENOUS | Status: DC
  Administered 2022-05-20: 2 g via INTRAVENOUS
  Filled 2022-05-20: qty 12.5

## 2022-05-20 NOTE — ED Provider Notes (Signed)
[facility] EMERGENCY DEPARTMENT Provider Note   CSN: [id number] Arrival date & time: 05/20/22  0558     History  Chief Complaint  Patient presents with   Generalized Body Aches   Weakness    Calvin Sanchez is a 31 y.o. male.  Calvin Sanchez is a 31 year old male with a past medical history of opioid use disorder who presents to the ED with complaints of subjective fevers, body aches, generalized malaise, decreased appetite, chest pain with inspiration that onset yesterday. Patient reports that he recently relapsed on heroin two days ago and the next day he used a "cotton needle," which he notes was contaminated. Patient states that after using the cotton needle, he started experiencing his stated symptoms and he is now concerned for a blood infection. Patient denies new palpitations, new rashes or lesions, visual disturbances, headaches, dizziness, lightheadedness, abdominal pain, nausea, vomiting, or diarrhea.  The history is provided by the patient. No language interpreter was used.  Weakness Severity:  Moderate Onset quality:  Gradual Duration:  1 day Timing:  Constant Progression:  Worsening Chronicity:  New Context: drug use   Context: not allergies   Relieved by:  Nothing Worsened by:  Nothing Ineffective treatments:  None tried Associated symptoms: no abdominal pain and no dysuria   Risk factors: no anemia and no diabetes        Home Medications Prior to Admission medications   Medication Sig Start Date End Date Taking? Authorizing Provider  acetaminophen (TYLENOL) 500 MG tablet Take 1 tablet (500 mg total) by mouth every 6 (six) hours as needed. 06/07/21   Domenic Moras, PA-C  clindamycin (CLEOCIN) 300 MG capsule Take 1 capsule (300 mg total) by mouth 3 (three) times daily. X 7 days 10/03/21   Drenda Freeze, MD  cloNIDine (CATAPRES) 0.1 MG tablet Take 1 pill  at 10 AM on 9/16 , 1 pill at 10 PM on 9/16 , followed by  one pill at 10 AM 9/17  and one pill 10 PM on 9/17, followed by one pill at 10 AM on 9/18 05/25/21   Ival Bible, MD  HYDROcodone-acetaminophen (NORCO/VICODIN) 5-325 MG tablet Take 1 tablet by mouth every 6 (six) hours as needed. 10/03/21   Drenda Freeze, MD      Allergies    Patient has no known allergies.    Review of Systems   Review of Systems  Gastrointestinal:  Negative for abdominal pain.  Genitourinary:  Negative for dysuria.  Neurological:  Positive for weakness.  All other systems reviewed and are negative.   Physical Exam Updated Vital Signs BP 108/75 (BP Location: Left Arm)   Pulse 95   Temp 97.9 F (36.6 C) (Oral)   Resp 16   Ht 6' (1.829 m)   Wt 74.8 kg   SpO2 96%   BMI 22.38 kg/m  Physical Exam Vitals reviewed.  Constitutional:      Appearance: Normal appearance.  HENT:     Right Ear: Tympanic membrane normal.     Left Ear: Tympanic membrane normal.     Mouth/Throat:     Mouth: Mucous membranes are moist.  Eyes:     Extraocular Movements: Extraocular movements intact.     Pupils: Pupils are equal, round, and reactive to light.  Cardiovascular:     Rate and Rhythm: Normal rate.  Pulmonary:     Effort: Pulmonary effort is normal.  Abdominal:     General: Abdomen is flat.  Musculoskeletal:  General: Normal range of motion.     Cervical back: Normal range of motion.  Skin:    General: Skin is warm.  Neurological:     General: No focal deficit present.     Mental Status: He is alert.  Psychiatric:        Mood and Affect: Mood normal.     ED Results / Procedures / Treatments   Labs (all labs ordered are listed, but only abnormal results are displayed) Labs Reviewed  BASIC METABOLIC PANEL - Abnormal; Notable for the following components:      Result Value   Sodium 134 (*)    Chloride 96 (*)    BUN 22 (*)    All other components within normal limits  CBC - Abnormal; Notable for the following components:   WBC 26.6 (*)    Hemoglobin 12.6 (*)     HCT 36.6 (*)    All other components within normal limits  URINALYSIS, ROUTINE W REFLEX MICROSCOPIC - Abnormal; Notable for the following components:   APPearance HAZY (*)    All other components within normal limits  CBG MONITORING, ED - Abnormal; Notable for the following components:   Glucose-Capillary 116 (*)    All other components within normal limits  RESP PANEL BY RT-PCR (FLU A&B, COVID) ARPGX2  CULTURE, BLOOD (ROUTINE X 2)  CULTURE, BLOOD (ROUTINE X 2)  HEPATIC FUNCTION PANEL  LACTIC ACID, PLASMA  LACTIC ACID, PLASMA    EKG None  Radiology DG Chest 2 View  Result Date: 05/20/2022 CLINICAL DATA:  Generalized body aches with weakness for 2 days EXAM: CHEST - 2 VIEW COMPARISON:  None Available. FINDINGS: Normal heart size and mediastinal contours. No acute infiltrate or edema. No effusion or pneumothorax. No acute osseous findings. IMPRESSION: Negative chest. Electronically Signed   By: Jorje Guild M.D.   On: 05/20/2022 09:39    Procedures Procedures    Medications Ordered in ED Medications  lactated ringers bolus 2,000 mL (2,000 mLs Intravenous New Bag/Given 05/20/22 1139)    ED Course/ Medical Decision Making/ A&P Clinical Course as of 05/20/22 1213  Mon May 20, 2022  1102 I have seen and evaluated the patient personally.  He has history of IVDU who came in with generalized fatigue, body aches and shortness of breath.  He does have evidence of splinter hemorrhages on toenails.  No other evidence or physical exam findings suggestive of endocarditis but on differential with leukocytosis 26.6 and elevated lactate 3.1.  He is receiving IV fluids and antibiotics and getting blood cultures taken.  We will admit for further work-up and management. [VB]    Clinical Course User Index [VB] Elgie Congo, MD                           Medical Decision Making Pt complains of feeling bad after using IV drugs.  Pt reports using a dirty needle  Amount and/or Complexity  of Data Reviewed External Data Reviewed: notes.    Details: Behavioral health notes reviewed   Labs: ordered. Decision-making details documented in ED Course.    Details: Sodium 134 BUN 22,   WBC 26.6 Lactic acid 2.4   Radiology: ordered.    Details: No acute abnormality   Risk Prescription drug management. Risk Details: Pt given IV fluids x 2 liters.  Iv antibiotics. Pt refuses admission.  Pt states he can not stay.  I advise pt I am concerned about sepsis.  Pt understands he may have infection in his blood/possible heart infection.  He is adamant he can not stay.   Pt plans to sign out ama,            Final Clinical Impression(s) / ED Diagnoses Final diagnoses:  Sepsis, due to unspecified organism, unspecified whether acute organ dysfunction present [facility])  IVDU (intravenous drug user)    Rx / DC Orders ED Discharge Orders     None         Sidney Ace 05/20/22 1439    Elgie Congo, MD 06/07/22 1319

## 2022-05-20 NOTE — ED Triage Notes (Signed)
PT reports he relapsed on heroin two days ago. Pt states he used a "dirty needle" and since then has had generalized weakness and generalized body aches.  Has had covid exposure

## 2022-05-20 NOTE — ED Notes (Signed)
Pt notified this nurse that his ride was here and he was leaving. Provider was notified and came to the bedside to talk to the pt. Pt informed of the risk and benefits of leaving. Pt IV removed, VS taken and AMA form signed. Pt ambulated out of the department.

## 2022-05-20 NOTE — Discharge Instructions (Signed)
You are leaving against medical advice.  I think this is a bad idea, please consider returning for appropriate treatment

## 2022-05-20 NOTE — Progress Notes (Signed)
Pharmacy Antibiotic Note  Calvin Sanchez is a 31 y.o. male admitted on 05/20/2022 with  suspected endocarditis .  Pt reports using heroin and last use was 2 days prior to admission. Pt reports using "dirty needle." Pharmacy has been consulted for Cefepime and Vancomycin dosing.  WBC 26.6, afebrile SCr 0.98  Plan: Start Cefepime 2g IV q8h  Initiate loading dose of Vancomycin '1750mg'$  IV x 1, followed by  Vancomycin '1250mg'$  IV q12h (eAUC ~467)    > Goal AUC 400-550    > Check vancomycin levels at steady state  Monitor daily CBC, temp, SCr, and for clinical signs of improvement  F/u cultures and de-escalate antibiotics as able   Height: 6' (182.9 cm) Weight: 74.8 kg (165 lb) IBW/kg (Calculated) : 77.6  Temp (24hrs), Avg:97.9 F (36.6 C), Min:97.8 F (36.6 C), Max:97.9 F (36.6 C)  Recent Labs  Lab 05/20/22 0654  WBC 26.6*  CREATININE 0.98    Estimated Creatinine Clearance: 114.5 mL/min (by C-G formula based on SCr of 0.98 mg/dL).    No Known Allergies  Antimicrobials this admission: Cefepime 9/11 >>  Vancomycin 9/11 >>   Dose adjustments this admission: N/A  Microbiology results: 9/11 BCx: pending  Thank you for allowing pharmacy to be a part of this patient's care.  Luisa Hart, PharmD, BCPS Clinical Pharmacist 05/20/2022 12:45 PM   Please refer to [facility] for pharmacy phone number

## 2022-05-25 LAB — CULTURE, BLOOD (ROUTINE X 2): Culture: NO GROWTH

## 2023-05-18 IMAGING — CT CT PELVIS W/ CM
2 of 3 series · 17 of 46 positions shown, 19 images · IV contrast (agent unspecified)
Comparison: None.

CLINICAL DATA: Rectal/anal abscess.

EXAM:
CT PELVIS WITH CONTRAST
TECHNIQUE: Multidetector CT imaging of the pelvis was performed using the
standard protocol following the bolus administration of intravenous
contrast.

[Series 5: soft tissue · axial · 0.67mm/px · z∈[+885,+1165]mm · 14 of 162 slices shown, 16 images]
[im 11/162  soft-tissue]
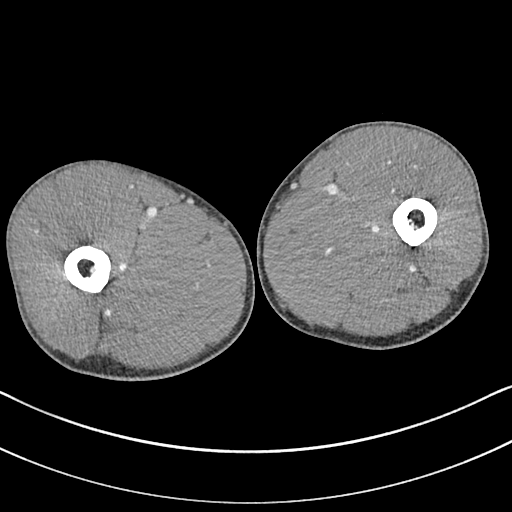
[im 11/162  bone]
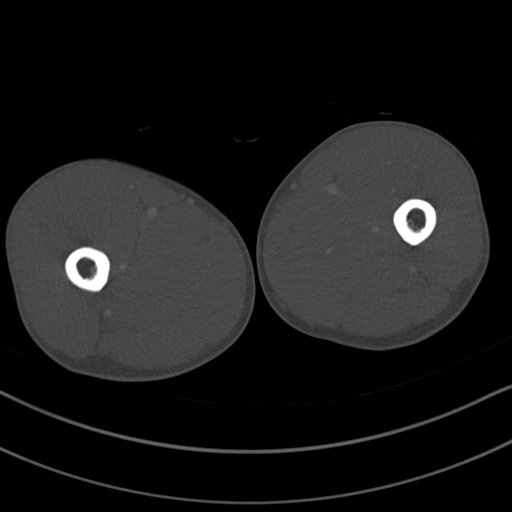
[im 21/162  soft-tissue]
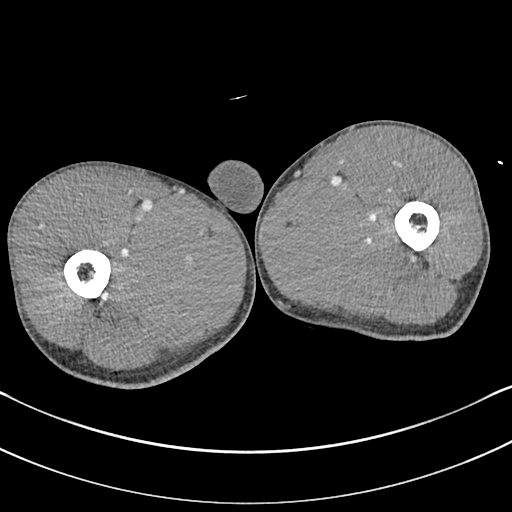
[im 32/162  soft-tissue]
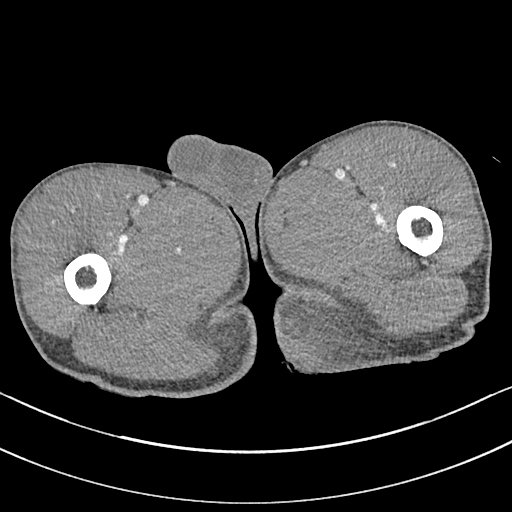
[im 42/162  soft-tissue]
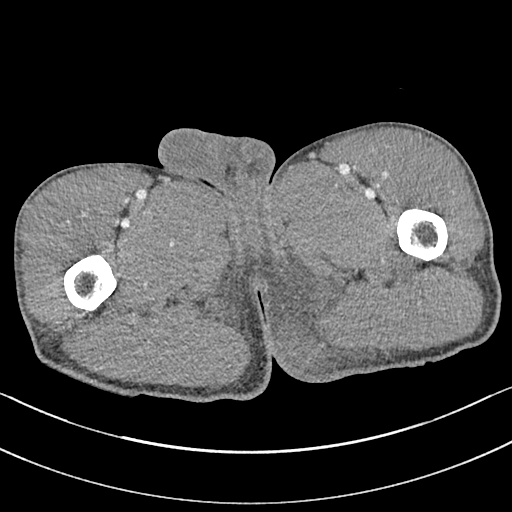
[im 52/162  soft-tissue]
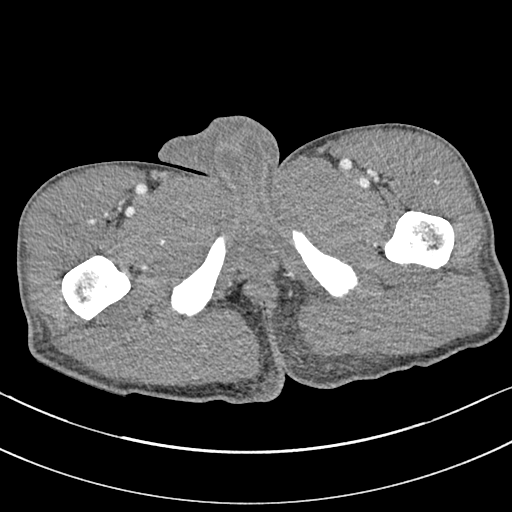
[im 63/162  soft-tissue]
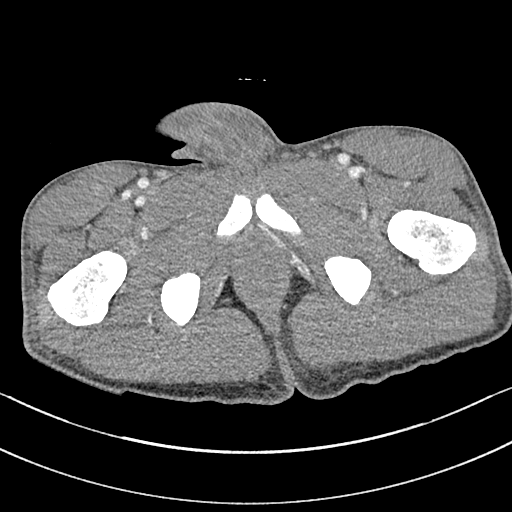
[im 73/162  soft-tissue]
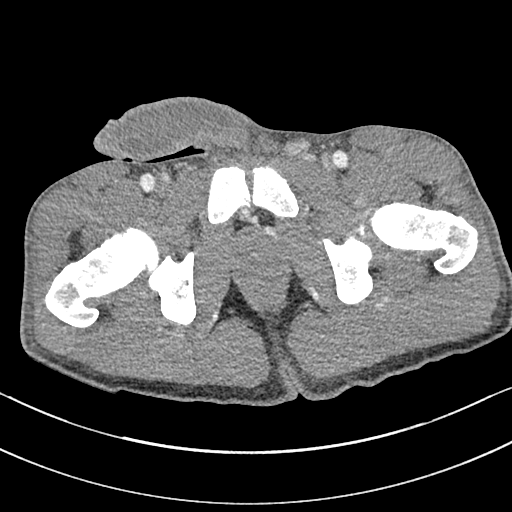
[im 89/162  soft-tissue]
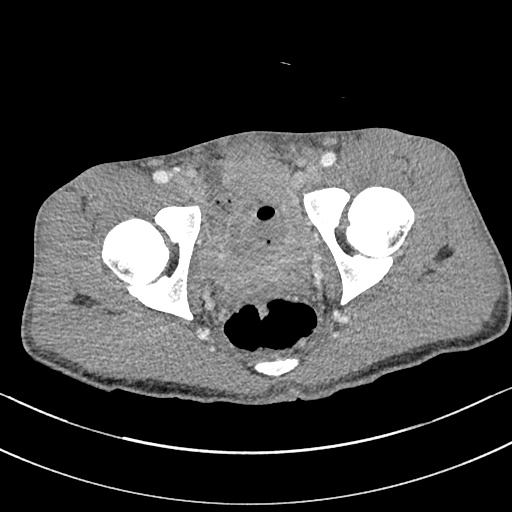
[im 99/162  soft-tissue]
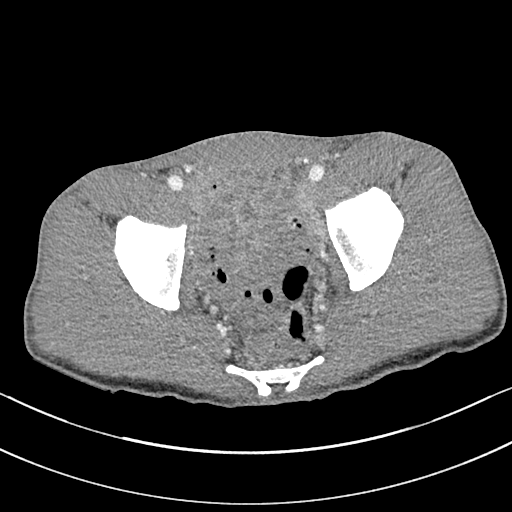
[im 99/162  bone]
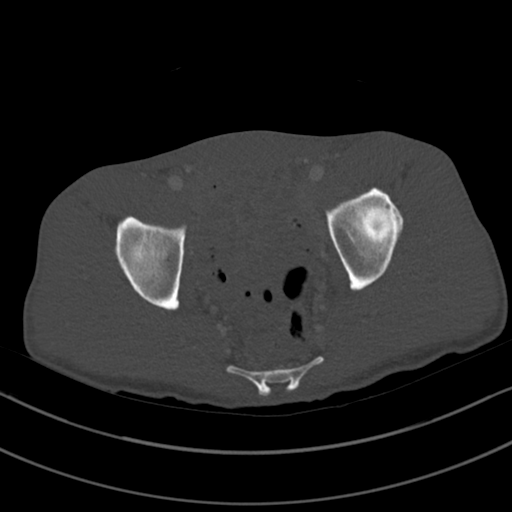
[im 110/162  soft-tissue]
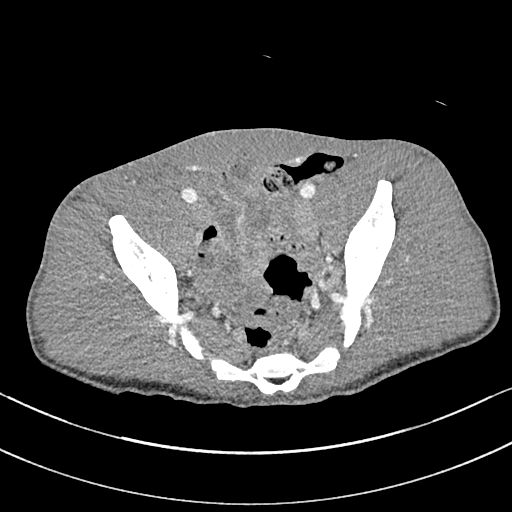
[im 120/162  soft-tissue]
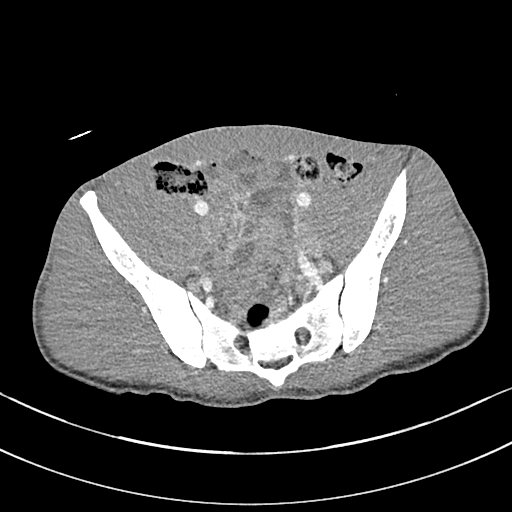
[im 130/162  soft-tissue]
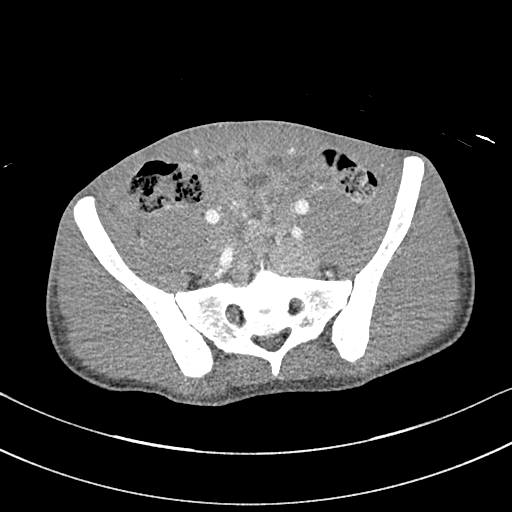
[im 141/162  soft-tissue]
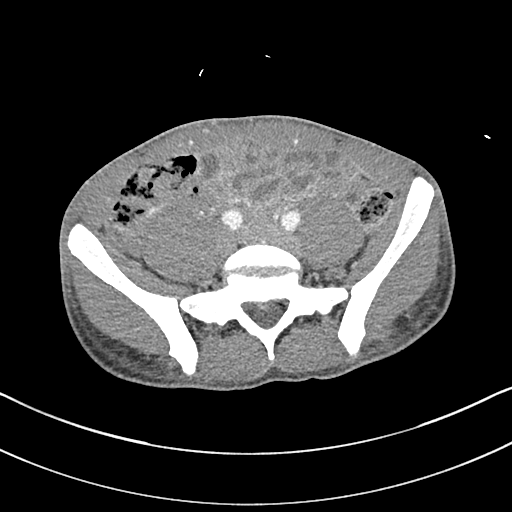
[im 151/162  soft-tissue]
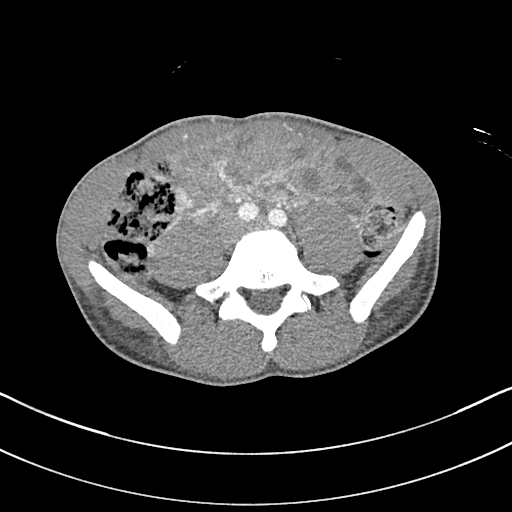

[Series 6: cor soft · coronal · 0.64mm/px · 3 of 115 slices shown]
[im 39/115  soft-tissue]
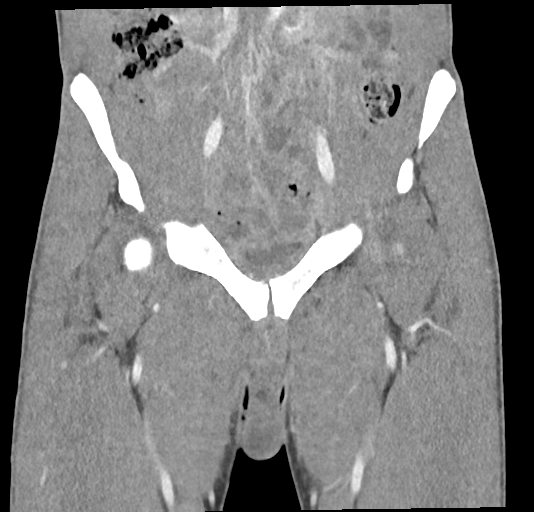
[im 51/115  soft-tissue]
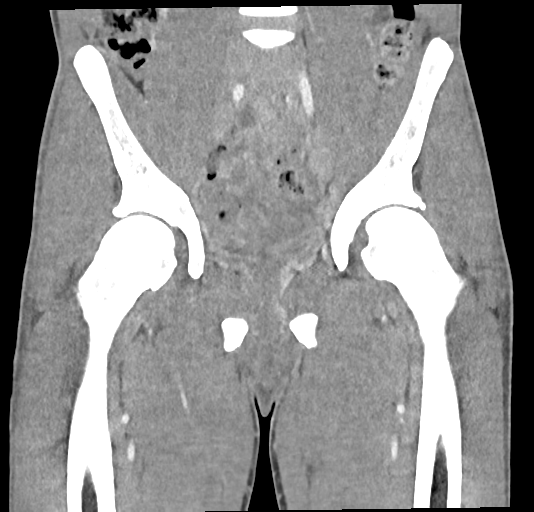
[im 64/115  soft-tissue]
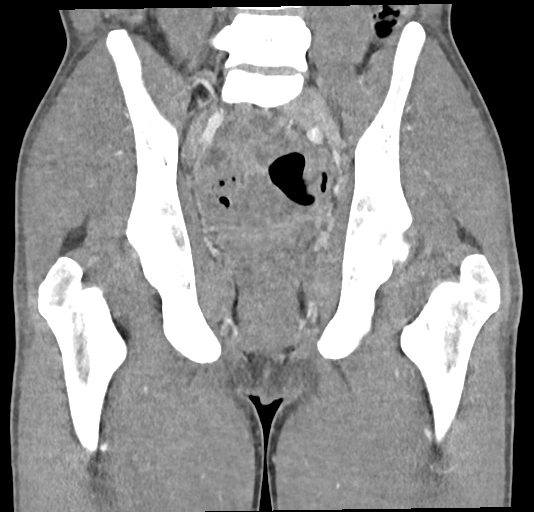

[17 of 46 positions shown; findings below may reference images not displayed]

RADIATION DOSE REDUCTION: This exam was performed according to the
departmental dose-optimization program which includes automated
exposure control, adjustment of the mA and/or kV according to
patient size and/or use of iterative reconstruction technique.

CONTRAST:  100mL OMNIPAQUE IOHEXOL 300 MG/ML  SOLN
FINDINGS: Urinary Tract: There is questionable diffuse bladder wall thickening
versus normal under distension.

Bowel: There is inflammatory stranding and skin thickening of the
inferior left buttocks. No evidence for soft tissue gas. No discrete
drainable fluid collection identified. Perianal and perirectal soft
tissues are within normal limits. No other acute bowel pathology
identified.

Vascular/Lymphatic: No pathologically enlarged lymph nodes. No
significant vascular abnormality seen.

Reproductive:  Choose 3

Other: There is some prominent left inguinal lymph nodes. No
abdominal wall hernia. No ascites.

Musculoskeletal: No suspicious bone lesions identified.
IMPRESSION: 1. Inflammation and skin thickening of the left buttocks may
represent cellulitis. No drainable fluid collection. No soft tissue
gas.
2. Questionable bladder wall thickening versus normal under
distension. Correlate clinically for cystitis.
